# Patient Record
Sex: Male | Born: 1960 | Race: White | Hispanic: No | Marital: Married | Smoking: Never smoker
Health system: Southern US, Community
[De-identification: ages and names within clinical notes are randomized; demographics above are authoritative.]

## PROBLEM LIST (undated history)

## (undated) DIAGNOSIS — I10 Essential (primary) hypertension: Secondary | ICD-10-CM

## (undated) DIAGNOSIS — F419 Anxiety disorder, unspecified: Secondary | ICD-10-CM

## (undated) DIAGNOSIS — M1990 Unspecified osteoarthritis, unspecified site: Secondary | ICD-10-CM

## (undated) DIAGNOSIS — E119 Type 2 diabetes mellitus without complications: Secondary | ICD-10-CM

## (undated) HISTORY — PX: ROTATOR CUFF REPAIR: SHX139

## (undated) HISTORY — PX: BACK SURGERY: SHX140

## (undated) HISTORY — PX: KNEE SURGERY: SHX244

---

## 2001-05-10 ENCOUNTER — Encounter: Payer: Self-pay | Admitting: Emergency Medicine

## 2001-05-10 ENCOUNTER — Emergency Department (HOSPITAL_COMMUNITY): Admission: EM | Admit: 2001-05-10 | Discharge: 2001-05-10 | Payer: Self-pay | Admitting: *Deleted

## 2014-02-22 ENCOUNTER — Emergency Department (HOSPITAL_COMMUNITY): Payer: BC Managed Care – PPO

## 2014-02-22 ENCOUNTER — Emergency Department (HOSPITAL_COMMUNITY)
Admission: EM | Admit: 2014-02-22 | Discharge: 2014-02-22 | Disposition: A | Discharge status: Home / Self Care | Payer: BC Managed Care – PPO | Attending: Emergency Medicine | Admitting: Emergency Medicine

## 2014-02-22 ENCOUNTER — Encounter (HOSPITAL_COMMUNITY): Payer: Self-pay | Admitting: Emergency Medicine

## 2014-02-22 DIAGNOSIS — L03211 Cellulitis of face: Secondary | ICD-10-CM

## 2014-02-22 DIAGNOSIS — L0201 Cutaneous abscess of face: Secondary | ICD-10-CM | POA: Insufficient documentation

## 2014-02-22 DIAGNOSIS — R51 Headache: Secondary | ICD-10-CM | POA: Insufficient documentation

## 2014-02-22 DIAGNOSIS — R599 Enlarged lymph nodes, unspecified: Secondary | ICD-10-CM | POA: Insufficient documentation

## 2014-02-22 LAB — CBC WITH DIFFERENTIAL/PLATELET
Basophils Absolute: 0 10*3/uL (ref 0.0–0.1)
Basophils Relative: 0 % (ref 0–1)
Eosinophils Absolute: 0.2 10*3/uL (ref 0.0–0.7)
Eosinophils Relative: 1 % (ref 0–5)
HCT: 35.7 % — ABNORMAL LOW (ref 39.0–52.0)
Hemoglobin: 12 g/dL — ABNORMAL LOW (ref 13.0–17.0)
Lymphocytes Relative: 21 % (ref 12–46)
Lymphs Abs: 3.3 10*3/uL (ref 0.7–4.0)
MCH: 29.6 pg (ref 26.0–34.0)
MCHC: 33.6 g/dL (ref 30.0–36.0)
MCV: 88.1 fL (ref 78.0–100.0)
Monocytes Absolute: 1.4 10*3/uL — ABNORMAL HIGH (ref 0.1–1.0)
Monocytes Relative: 9 % (ref 3–12)
Neutro Abs: 11 10*3/uL — ABNORMAL HIGH (ref 1.7–7.7)
Neutrophils Relative %: 69 % (ref 43–77)
Platelets: 351 10*3/uL (ref 150–400)
RBC: 4.05 MIL/uL — ABNORMAL LOW (ref 4.22–5.81)
RDW: 13.3 % (ref 11.5–15.5)
WBC: 15.9 10*3/uL — ABNORMAL HIGH (ref 4.0–10.5)

## 2014-02-22 LAB — I-STAT CHEM 8, ED
BUN: <3 mg/dL — ABNORMAL LOW (ref 6–23)
Calcium, Ion: 1.25 mmol/L — ABNORMAL HIGH (ref 1.12–1.23)
Chloride: 98 mEq/L (ref 96–112)
Creatinine, Ser: 0.7 mg/dL (ref 0.50–1.35)
Glucose, Bld: 82 mg/dL (ref 70–99)
HCT: 38 % — ABNORMAL LOW (ref 39.0–52.0)
Hemoglobin: 12.9 g/dL — ABNORMAL LOW (ref 13.0–17.0)
Potassium: 3.5 mEq/L — ABNORMAL LOW (ref 3.7–5.3)
Sodium: 141 mEq/L (ref 137–147)
TCO2: 28 mmol/L (ref 0–100)

## 2014-02-22 MED ORDER — MORPHINE SULFATE 4 MG/ML IJ SOLN
4.0000 mg | Freq: Once | INTRAMUSCULAR | Status: AC
  Administered 2014-02-22: 4 mg via INTRAVENOUS
  Filled 2014-02-22: qty 1

## 2014-02-22 MED ORDER — IOHEXOL 300 MG/ML  SOLN
100.0000 mL | Freq: Once | INTRAMUSCULAR | Status: AC | PRN
  Administered 2014-02-22: 100 mL via INTRAVENOUS

## 2014-02-22 MED ORDER — CLINDAMYCIN PHOSPHATE 600 MG/50ML IV SOLN
600.0000 mg | Freq: Once | INTRAVENOUS | Status: AC
  Administered 2014-02-22: 600 mg via INTRAVENOUS
  Filled 2014-02-22: qty 50

## 2014-02-22 MED ORDER — HYDROMORPHONE HCL PF 1 MG/ML IJ SOLN
1.0000 mg | Freq: Once | INTRAMUSCULAR | Status: AC
  Administered 2014-02-22: 1 mg via INTRAVENOUS
  Filled 2014-02-22: qty 1

## 2014-02-22 NOTE — ED Notes (Signed)
Pt complaining of right sided facial swelling. Pt has been taking pain meds and abx. Doctor sent here for cellulitis.

## 2014-02-22 NOTE — Discharge Instructions (Signed)
Continue with warm compresses applied to affected area 20 minutes each several times daily. Take antibiotic as prescribed for the full duration. Take pain medication as needed. If you noticed no improvement after 48 hours please return to the ER for further evaluation.  Cellulitis Cellulitis is an infection of the skin and the tissue beneath it. The infected area is usually red and tender. Cellulitis occurs most often in the arms and lower legs.  CAUSES  Cellulitis is caused by bacteria that enter the skin through cracks or cuts in the skin. The most common types of bacteria that cause cellulitis are Staphylococcus and Streptococcus. SYMPTOMS   Redness and warmth.  Swelling.  Tenderness or pain.  Fever. DIAGNOSIS  Your caregiver can usually determine what is wrong based on a physical exam. Blood tests may also be done. TREATMENT  Treatment usually involves taking an antibiotic medicine. HOME CARE INSTRUCTIONS   Take your antibiotics as directed. Finish them even if you start to feel better.  Keep the infected arm or leg elevated to reduce swelling.  Apply a warm cloth to the affected area up to 4 times per day to relieve pain.  Only take over-the-counter or prescription medicines for pain, discomfort, or fever as directed by your caregiver.  Keep all follow-up appointments as directed by your caregiver. SEEK MEDICAL CARE IF:   You notice red streaks coming from the infected area.  Your red area gets larger or turns dark in color.  Your bone or joint underneath the infected area becomes painful after the skin has healed.  Your infection returns in the same area or another area.  You notice a swollen bump in the infected area.  You develop new symptoms. SEEK IMMEDIATE MEDICAL CARE IF:   You have a fever.  You feel very sleepy.  You develop vomiting or diarrhea.  You have a general ill feeling (malaise) with muscle aches and pains. MAKE SURE YOU:   Understand these  instructions.  Will watch your condition.  Will get help right away if you are not doing well or get worse. Document Released: 05/27/2005 Document Revised: 02/16/2012 Document Reviewed: 11/02/2011 [facility] Patient Information 2015 [employer] [place]. This information is not intended to replace advice given to you by your health care provider. Make sure you discuss any questions you have with your health care provider.

## 2014-02-22 NOTE — ED Provider Notes (Signed)
Medical screening examination/treatment/procedure(s) were conducted as a shared visit with non-physician practitioner(s) and myself.  I personally evaluated the patient during the encounter.   EKG Interpretation None       Doug SouSam Jr Milliron, MD 02/22/14 2200

## 2014-02-22 NOTE — ED Provider Notes (Signed)
CSN: [id number]     Arrival date & time 02/22/14  1242 History   First MD Initiated Contact with Patient 02/22/14 1442     Chief Complaint  Patient presents with  . Facial Swelling     (Consider location/radiation/quality/duration/timing/severity/associated sxs/prior Treatment) HPI  53 year old male who was sent here from his primary care Dr. for further evaluation and management of his right face infection.  Patient reports he popped a facial pimple about week ago. Pimple is to the left side of face. Subsequently it became inflamed, and now is a throbbing sharp pain with redness throughout his left side of face. Pain is unbearable worsen with movement or with palpation. He was seen at urgent care yesterday for this complaint. States they performed an I&D with minimal drainage. He was prescribed mupirocin, and also prescribe Keflex and Bactrim. Although he is taking his medications since yesterday to provide actually no improvement, his symptoms worsen. No fever, chills, hearing changes, nausea vomiting diarrhea, or rash. He is up-to-date with his tetanus. He has no known documented history of diabetes however is taking metformin. He has a recent right shoulder surgical repair a month ago and currently taking pain medication for that. States his pain medication has not helped with the symptoms.  History reviewed. No pertinent past medical history. Past Surgical History  Procedure Laterality Date  . Back surgery    . Rotator cuff repair     History reviewed. No pertinent family history. History  Substance Use Topics  . Smoking status: Never Smoker   . Smokeless tobacco: Not on file  . Alcohol Use: No    Review of Systems  Constitutional: Negative for fever.  Skin: Positive for rash.  Neurological: Positive for headaches.      Allergies  Review of patient's allergies indicates no known allergies.  Home Medications   Prior to Admission medications   Not on File   BP 115/70   Pulse 110  Temp(Src) 97.8 F (36.6 C) (Oral)  Resp 18  Wt 165 lb (74.844 kg)  SpO2 98% Physical Exam  Constitutional: He is oriented to person, place, and time. He appears well-developed and well-nourished. No distress.  HENT:  Head: Atraumatic.  Left Ear: External ear normal.  L side of face: an area of induration noted anterior to L earlobe with honey color crust drainage and significant erythema involving L earlobe, L temporal region and radiates to proxima left jaw.  Markedly tender.    Eyes: Conjunctivae are normal.  Neck: Normal range of motion. Neck supple.  Cardiovascular:  Tachycardiac without M/R/G  Pulmonary/Chest: Effort normal and breath sounds normal.  Abdominal: Soft. There is no tenderness.  Lymphadenopathy:    He has cervical adenopathy.  Neurological: He is alert and oriented to person, place, and time.  Skin: No rash noted.  Psychiatric: He has a normal mood and affect.    ED Course  Procedures (including critical care time)  3:02 PM Cellulitis and possible abscess noted to L side of face.  Pt just started on abx, but haven't given it adequate time to work yet.  Here with worsening pain.  Given location of infection my fear of I&D that could potentially injure facial nerves.  Therefore will consider CT for further evaluation.   8:20 PM CT demonstrates evidence of cellulitis but no discrete abscess.  Pt received IV clindamycin in ER along with pain management.  Pt will continue outpt abx regimen and return in 48 hrs if no improvement.  Otherwise pt  able to tolerates PO.  Care discussed with Dr. Ethelda ChickJacubowitz.    Labs Review Labs Reviewed  CBC WITH DIFFERENTIAL - Abnormal; Notable for the following:    WBC 15.9 (*)    RBC 4.05 (*)    Hemoglobin 12.0 (*)    HCT 35.7 (*)    Neutro Abs 11.0 (*)    Monocytes Absolute 1.4 (*)    All other components within normal limits  I-STAT CHEM 8, ED - Abnormal; Notable for the following:    Potassium 3.5 (*)    BUN <3 (*)     Calcium, Ion 1.25 (*)    Hemoglobin 12.9 (*)    HCT 38.0 (*)    All other components within normal limits    Imaging Review Ct Maxillofacial W/cm  02/22/2014   CLINICAL DATA:  Cellulitis, soft tissue swelling over the left temporal region  EXAM: CT MAXILLOFACIAL WITH CONTRAST  TECHNIQUE: Multidetector CT imaging of the maxillofacial structures was performed with intravenous contrast. Multiplanar CT image reconstructions were also generated. A small metallic BB was placed on the right temple in order to reliably differentiate right from left.  CONTRAST:  100mL OMNIPAQUE IOHEXOL 300 MG/ML  SOLN  COMPARISON:  None.  FINDINGS: There is soft tissue swelling and fat stranding within the subcutaneous fat along the left temple extending into the left anterior neck superficial to the platysma, without a drainable fluid collection. The globes are intact. The orbital walls are intact. The orbital floors are intact. The maxilla is intact. The mandible is intact. The zygomatic arches are intact. The nasal septum is midline. There is no nasal bone fracture. The temporomandibular joints are normal.  The paranasal sinuses are clear. The visualized portions of the mastoid sinuses are well aerated.  There is degenerative disc disease with a broad-based disc bulge at C5-6 and C6-7.  There is a 6 mm hypodense nodule in the left lobe of the thyroid gland.  IMPRESSION: Soft tissue swelling and fat stranding within the subcutaneous fat of the left temple and left neck concerning for cellulitis without a drainable abscess.   Electronically Signed   By: Elige KoHetal  Patel   On: 02/22/2014 18:10     EKG Interpretation None      MDM   Final diagnoses:  Cellulitis, face    BP 132/79  Pulse 99  Temp(Src) 97.8 F (36.6 C) (Oral)  Resp 18  Wt 165 lb (74.844 kg)  SpO2 97%  I have reviewed nursing notes and vital signs. I personally reviewed the imaging tests through PACS system  I reviewed available ER/hospitalization  records thought the EMR     Fayrene HelperBowie Tran, [place] JerseyPA-C 02/22/14 2021

## 2014-02-22 NOTE — ED Provider Notes (Signed)
Plains of swelling and pain to the left side of face onset 3 or 4 days ago. Patient seen by Dr tafeen for same complaint, described hydromorphone and mupirocin, Bactrim and Keflex presents today his pain continues. Denies fever denies nausea or vomiting. No other associated symptoms  Jacob SouSam Kyndle Schlender, MD 02/22/14 [phone]

## 2018-11-29 ENCOUNTER — Encounter: Payer: Self-pay | Admitting: *Deleted

## 2019-10-09 ENCOUNTER — Other Ambulatory Visit: Payer: Self-pay

## 2019-10-09 ENCOUNTER — Telehealth: Payer: Self-pay

## 2019-10-09 ENCOUNTER — Ambulatory Visit: Payer: Self-pay | Attending: Internal Medicine

## 2019-10-09 DIAGNOSIS — Z20822 Contact with and (suspected) exposure to covid-19: Secondary | ICD-10-CM | POA: Insufficient documentation

## 2019-10-09 NOTE — Telephone Encounter (Signed)
Pt's wife called to get pt an appt. To get covid screening due to exposure. Pt is not having sx. Appt made for today for 12:45.

## 2019-10-10 LAB — NOVEL CORONAVIRUS, NAA: SARS-CoV-2, NAA: NOT DETECTED

## 2021-08-12 ENCOUNTER — Other Ambulatory Visit: Payer: Self-pay

## 2021-08-12 ENCOUNTER — Encounter: Payer: Self-pay | Admitting: Orthopaedic Surgery

## 2021-08-12 ENCOUNTER — Ambulatory Visit: Payer: Self-pay

## 2021-08-12 ENCOUNTER — Ambulatory Visit: Payer: BC Managed Care – PPO | Admitting: Orthopaedic Surgery

## 2021-08-12 VITALS — Ht 66.0 in | Wt 200.0 lb

## 2021-08-12 DIAGNOSIS — M1611 Unilateral primary osteoarthritis, right hip: Secondary | ICD-10-CM | POA: Diagnosis not present

## 2021-08-12 NOTE — Progress Notes (Signed)
Office Visit Note   Patient: Jacob Bates           Date of Birth: [birth year]           MRN: [id number] Visit Date: 08/12/2021              Requested by: No referring provider defined for this encounter. PCP: No primary care provider on file.   Assessment & Plan: Visit Diagnoses:  1. Primary osteoarthritis of right hip     Plan: Impression is end-stage right hip DJD.  He has not received any relief from cortisone injections and medications and use of a cane.  He has significant limitation in mobility and loss of quality of life.  Given his options he has elected to proceed with a right total hip replacement towards the end of January because of recent cortisone injection.  Risk benefits rehab recovery reviewed with the patient detail.  Questions encouraged and answered.  We will obtain hemoglobin A1c and prealbumin levels today.  Follow-Up Instructions: No follow-ups on file.   Orders:  Orders Placed This Encounter  Procedures   XR HIP UNILAT W OR W/O PELVIS 2-3 VIEWS RIGHT   Hemoglobin A1C   Prealbumin   No orders of the defined types were placed in this encounter.     Procedures: No procedures performed   Clinical Data: No additional findings.   Subjective: Chief Complaint  Patient presents with   Right Hip - Pain    [name] a very pleasant 60 year old gentleman with chronic low back pain and takes chronic oxycodone who comes in for chronic severe right hip and groin pain that has been worsening.  He has seen his primary care doctor who initially managed his pain with medications but eventually made a referral to [facility] orthopedics in [place].  He was evaluated by Dr. Orson Aloe there and he underwent intra-articular cortisone injection which gave him relief for about 3 days.  This injection was performed on 07/29/2021.  He states that he continues to have severe pain now that the shot has worn off.  He cannot sleep.  He walks with a cane.  He has a brother Jacob Bates who  is scheduled for a total hip replacement with me in the near future.   Review of Systems  Constitutional: Negative.   All other systems reviewed and are negative.   Objective: Vital Signs: Ht 5\' 6"  (1.676 m)    Wt 200 lb (90.7 kg)    BMI 32.28 kg/m   Physical Exam Vitals and nursing note reviewed.  Constitutional:      Appearance: He is well-developed.  Pulmonary:     Effort: Pulmonary effort is normal.  Abdominal:     Palpations: Abdomen is soft.  Skin:    General: Skin is warm.  Neurological:     Mental Status: He is alert and oriented to person, place, and time.  Psychiatric:        Behavior: Behavior normal.        Thought Content: Thought content normal.        Judgment: Judgment normal.    Ortho Exam  Right hip exam shows excruciating pain with any attempted range of motion.  Unable to perform Stinchfield sign secondary to pain.  Pain with logroll.  Positive FADIR at 0 degrees.  Lateral hip is nontender.  No sciatic tension signs.  Specialty Comments:  No specialty comments available.  Imaging: XR HIP UNILAT W OR W/O PELVIS 2-3 VIEWS RIGHT  Result Date: 08/12/2021  Bone-on-bone joint space narrowing of the right hip joint.    PMFS History: There are no problems to display for this patient.  History reviewed. No pertinent past medical history.  No family history on file.  Past Surgical History:  Procedure Laterality Date   BACK SURGERY     ROTATOR CUFF REPAIR     Social History   Occupational History   Not on file  Tobacco Use   Smoking status: Never   Smokeless tobacco: Not on file  Substance and Sexual Activity   Alcohol use: No   Drug use: Not on file   Sexual activity: Not on file

## 2021-08-13 LAB — PREALBUMIN: Prealbumin: 38 mg/dL (ref 21–43)

## 2021-08-13 LAB — HEMOGLOBIN A1C
Hgb A1c MFr Bld: 6.5 % of total Hgb — ABNORMAL HIGH (ref <5.7)
Mean Plasma Glucose: 140 mg/dL
eAG (mmol/L): 7.7 mmol/L

## 2021-09-11 ENCOUNTER — Other Ambulatory Visit: Payer: Self-pay

## 2021-09-22 ENCOUNTER — Other Ambulatory Visit: Payer: Self-pay | Admitting: Physician Assistant

## 2021-09-22 MED ORDER — ASPIRIN EC 81 MG PO TBEC: 81.0000 mg | DELAYED_RELEASE_TABLET | Freq: Two times a day (BID) | ORAL | 0 refills | Status: AC

## 2021-09-22 MED ORDER — DOCUSATE SODIUM 100 MG PO CAPS: 100.0000 mg | ORAL_CAPSULE | Freq: Every day | ORAL | 2 refills | Status: AC | PRN

## 2021-09-22 MED ORDER — ONDANSETRON HCL 4 MG PO TABS: 4.0000 mg | ORAL_TABLET | Freq: Three times a day (TID) | ORAL | 0 refills | Status: AC | PRN

## 2021-09-22 MED ORDER — OXYCODONE-ACETAMINOPHEN 5-325 MG PO TABS: 1.0000 | ORAL_TABLET | Freq: Four times a day (QID) | ORAL | 0 refills | Status: AC | PRN

## 2021-09-22 MED ORDER — METHOCARBAMOL 500 MG PO TABS: 500.0000 mg | ORAL_TABLET | Freq: Two times a day (BID) | ORAL | 0 refills | Status: AC | PRN

## 2021-09-24 NOTE — Pre-Procedure Instructions (Addendum)
Surgical Instructions    Your procedure is scheduled on Monday, September 29, 2021 at 3:08 PM.  Report to [facility] Main Entrance "A" at 1:00 P.M., then check in with the Admitting office.  Call this number if you have problems the morning of surgery:  [phone]   If you have any questions prior to your surgery date call [phone]: Open Monday-Friday 8am-4pm    Remember:  Do not eat after midnight the night before your surgery  You may drink clear liquids until 12:00 PM the morning of your surgery.   Clear liquids allowed are: Water, Non-Citrus Juices (without pulp), Carbonated Beverages, Clear Tea, Black Coffee Only (NO MILK, CREAM OR POWDERED CREAMER of any kind), and Gatorade.    Enhanced Recovery after Surgery for Orthopedics Enhanced Recovery after Surgery is a protocol used to improve the stress on your body and your recovery after surgery.  Patient Instructions  The day of surgery (if you have diabetes):  Drink ONE small 10 oz bottle of water by 12:00 pm the morning of surgery This bottle was given to you during your hospital  pre-op appointment visit.  Nothing else to drink after completing the  Small 10 oz bottle of water.         If you have questions, please contact your surgeons office.   Take these medicines the morning of surgery with A SIP OF WATER:  esomeprazole (NEXIUM) gabapentin (NEURONTIN)   IF NEEDED: acetaminophen (TYLENOL)  diphenhydrAMINE (BENADRYL) fluticasone (FLONASE) methocarbamol (ROBAXIN)  ondansetron (ZOFRAN) oxycodone (ROXICODONE)  As of today, STOP taking any Aspirin (unless otherwise instructed by your surgeon) Aleve, Naproxen, Ibuprofen, Motrin, Advil, Goody's, BC's, all herbal medications, fish oil, and all vitamins.  WHAT DO I DO ABOUT MY DIABETES MEDICATION?   Do not take metFORMIN (GLUCOPHAGE-XR) the morning of surgery.   HOW TO MANAGE YOUR DIABETES BEFORE AND AFTER SURGERY  Why is it important to control my blood  sugar before and after surgery? Improving blood sugar levels before and after surgery helps healing and can limit problems. A way of improving blood sugar control is eating a healthy diet by:  Eating less sugar and carbohydrates  Increasing activity/exercise  Talking with your doctor about reaching your blood sugar goals High blood sugars (greater than 180 mg/dL) can raise your risk of infections and slow your recovery, so you will need to focus on controlling your diabetes during the weeks before surgery. Make sure that the doctor who takes care of your diabetes knows about your planned surgery including the date and location.  How do I manage my blood sugar before surgery? Check your blood sugar at least 4 times a day, starting 2 days before surgery, to make sure that the level is not too high or low.  Check your blood sugar the morning of your surgery when you wake up and every 2 hours until you get to the Short Stay unit.  If your blood sugar is less than 70 mg/dL, you will need to treat for low blood sugar: Do not take insulin. Treat a low blood sugar (less than 70 mg/dL) with  cup of clear juice (cranberry or apple), 4 glucose tablets, OR glucose gel. Recheck blood sugar in 15 minutes after treatment (to make sure it is greater than 70 mg/dL). If your blood sugar is not greater than 70 mg/dL on recheck, call [phone] for further instructions. Report your blood sugar to the short stay nurse when you get to Short Stay.  If you  are admitted to the hospital after surgery: Your blood sugar will be checked by the staff and you will probably be given insulin after surgery (instead of oral diabetes medicines) to make sure you have good blood sugar levels. The goal for blood sugar control after surgery is 80-180 mg/dL.                      Do NOT Smoke (Tobacco/Vaping) for 24 hours prior to your procedure.  If you use a CPAP at night, you may bring your mask/headgear for your overnight  stay.   Contacts, glasses, piercing's, hearing aid's, dentures or partials may not be worn into surgery, please bring cases for these belongings.    For patients admitted to the hospital, discharge time will be determined by your treatment team.   Patients discharged the day of surgery will not be allowed to drive home, and someone needs to stay with them for 24 hours.  NO VISITORS WILL BE ALLOWED IN PRE-OP WHERE PATIENTS ARE PREPPED FOR SURGERY.  ONLY 1 SUPPORT PERSON MAY BE PRESENT IN THE WAITING ROOM WHILE YOU ARE IN SURGERY.  IF YOU ARE TO BE ADMITTED, ONCE YOU ARE IN YOUR ROOM YOU WILL BE ALLOWED TWO (2) VISITORS. (1) VISITOR MAY STAY OVERNIGHT BUT MUST ARRIVE TO THE ROOM BY 8pm.  Minor children may have two parents present. Special consideration for safety and communication needs will be reviewed on a case by case basis.   Special instructions:   [place] [facility]- Preparing For Surgery  Before surgery, you can play an important role. Because skin is not sterile, your skin needs to be as free of germs as possible. You can reduce the number of germs on your skin by washing with CHG (chlorahexidine gluconate) Soap before surgery.  CHG is an antiseptic cleaner which kills germs and bonds with the skin to continue killing germs even after washing.    Oral Hygiene is also important to reduce your risk of infection.  Remember - BRUSH YOUR TEETH THE MORNING OF SURGERY WITH YOUR REGULAR TOOTHPASTE  Please do not use if you have an allergy to CHG or antibacterial soaps. If your skin becomes reddened/irritated stop using the CHG.  Do not shave (including legs and underarms) for at least 48 hours prior to first CHG shower. It is OK to shave your face.  Please follow these instructions carefully.   Shower the NIGHT BEFORE SURGERY and the MORNING OF SURGERY  If you chose to wash your hair, wash your hair first as usual with your normal shampoo.  After you shampoo, rinse your hair and body thoroughly to  remove the shampoo.  Use CHG Soap as you would any other liquid soap. You can apply CHG directly to the skin and wash gently with a scrungie or a clean washcloth.   Apply the CHG Soap to your body ONLY FROM THE NECK DOWN.  Do not use on open wounds or open sores. Avoid contact with your eyes, ears, mouth and genitals (private parts). Wash Face and genitals (private parts)  with your normal soap.   Wash thoroughly, paying special attention to the area where your surgery will be performed.  Thoroughly rinse your body with warm water from the neck down.  DO NOT shower/wash with your normal soap after using and rinsing off the CHG Soap.  Pat yourself dry with a CLEAN TOWEL.  Wear CLEAN PAJAMAS to bed the night before surgery  Place CLEAN SHEETS on your  bed the night before your surgery  DO NOT SLEEP WITH PETS.   Day of Surgery: Shower with CHG soap. Do not wear jewelry. Do not wear lotions, powders, colognes, or deodorant. Do not shave 48 hours prior to surgery.  Men may shave face and neck. Do not bring valuables to the hospital. [facility] is not responsible for any belongings or valuables. Wear Clean/Comfortable clothing the morning of surgery Remember to brush your teeth WITH YOUR REGULAR TOOTHPASTE.   Please read over the following fact sheets that you were given.   3 days prior to your procedure or After your COVID test   You are not required to quarantine however you are required to wear a well-fitting mask when you are out and around people not in your household. If your mask becomes wet or soiled, replace with a new one.   Wash your hands often with soap and water for 20 seconds or clean your hands with an alcohol-based hand sanitizer that contains at least 60% alcohol.   Do not share personal items.   Notify your provider:  o if you are in close contact with someone who has COVID  o or if you develop a fever of 100.4 or greater, sneezing, cough, sore throat,  shortness of breath or body aches.

## 2021-09-25 ENCOUNTER — Other Ambulatory Visit: Payer: Self-pay | Admitting: Physician Assistant

## 2021-09-25 ENCOUNTER — Inpatient Hospital Stay (HOSPITAL_COMMUNITY)
Admission: RE | Admit: 2021-09-25 | Discharge: 2021-09-25 | Disposition: A | Discharge status: Home / Self Care | Payer: BC Managed Care – PPO | Source: Ambulatory Visit

## 2021-09-26 ENCOUNTER — Other Ambulatory Visit: Payer: Self-pay

## 2021-09-26 ENCOUNTER — Encounter (HOSPITAL_COMMUNITY): Payer: Self-pay

## 2021-09-26 ENCOUNTER — Telehealth: Payer: Self-pay | Admitting: Orthopaedic Surgery

## 2021-09-26 ENCOUNTER — Encounter (HOSPITAL_COMMUNITY)
Admission: RE | Admit: 2021-09-26 | Discharge: 2021-09-26 | Disposition: A | Discharge status: Home / Self Care | Payer: BC Managed Care – PPO | Source: Ambulatory Visit | Attending: Orthopaedic Surgery | Admitting: Orthopaedic Surgery

## 2021-09-26 VITALS — BP 152/101 | HR 96 | Temp 98.3°F | Resp 19 | Ht 66.0 in | Wt 218.0 lb

## 2021-09-26 DIAGNOSIS — I10 Essential (primary) hypertension: Secondary | ICD-10-CM | POA: Diagnosis not present

## 2021-09-26 DIAGNOSIS — M1611 Unilateral primary osteoarthritis, right hip: Secondary | ICD-10-CM | POA: Insufficient documentation

## 2021-09-26 DIAGNOSIS — Z20822 Contact with and (suspected) exposure to covid-19: Secondary | ICD-10-CM | POA: Insufficient documentation

## 2021-09-26 DIAGNOSIS — Z01818 Encounter for other preprocedural examination: Secondary | ICD-10-CM | POA: Diagnosis present

## 2021-09-26 DIAGNOSIS — E119 Type 2 diabetes mellitus without complications: Secondary | ICD-10-CM | POA: Insufficient documentation

## 2021-09-26 HISTORY — DX: Anxiety disorder, unspecified: F41.9

## 2021-09-26 HISTORY — DX: Unspecified osteoarthritis, unspecified site: M19.90

## 2021-09-26 HISTORY — DX: Type 2 diabetes mellitus without complications: E11.9

## 2021-09-26 HISTORY — DX: Essential (primary) hypertension: I10

## 2021-09-26 LAB — CBC WITH DIFFERENTIAL/PLATELET
Abs Immature Granulocytes: 0.03 10*3/uL (ref 0.00–0.07)
Basophils Absolute: 0.1 10*3/uL (ref 0.0–0.1)
Basophils Relative: 1 %
Eosinophils Absolute: 0.1 10*3/uL (ref 0.0–0.5)
Eosinophils Relative: 2 %
HCT: 40.8 % (ref 39.0–52.0)
Hemoglobin: 13.2 g/dL (ref 13.0–17.0)
Immature Granulocytes: 0 %
Lymphocytes Relative: 24 %
Lymphs Abs: 1.8 10*3/uL (ref 0.7–4.0)
MCH: 28.6 pg (ref 26.0–34.0)
MCHC: 32.4 g/dL (ref 30.0–36.0)
MCV: 88.3 fL (ref 80.0–100.0)
Monocytes Absolute: 0.7 10*3/uL (ref 0.1–1.0)
Monocytes Relative: 10 %
Neutro Abs: 4.8 10*3/uL (ref 1.7–7.7)
Neutrophils Relative %: 63 %
Platelets: 334 10*3/uL (ref 150–400)
RBC: 4.62 MIL/uL (ref 4.22–5.81)
RDW: 14 % (ref 11.5–15.5)
WBC: 7.4 10*3/uL (ref 4.0–10.5)
nRBC: 0 % (ref 0.0–0.2)

## 2021-09-26 LAB — GLUCOSE, CAPILLARY: Glucose-Capillary: 119 mg/dL — ABNORMAL HIGH (ref 70–99)

## 2021-09-26 LAB — COMPREHENSIVE METABOLIC PANEL
ALT: 18 U/L (ref 0–44)
AST: 18 U/L (ref 15–41)
Albumin: 3.7 g/dL (ref 3.5–5.0)
Alkaline Phosphatase: 74 U/L (ref 38–126)
Anion gap: 6 (ref 5–15)
BUN: 8 mg/dL (ref 8–23)
CO2: 29 mmol/L (ref 22–32)
Calcium: 8.9 mg/dL (ref 8.9–10.3)
Chloride: 105 mmol/L (ref 98–111)
Creatinine, Ser: 0.83 mg/dL (ref 0.61–1.24)
GFR, Estimated: >60 mL/min (ref >60)
Glucose, Bld: 111 mg/dL — ABNORMAL HIGH (ref 70–99)
Potassium: 3.9 mmol/L (ref 3.5–5.1)
Sodium: 140 mmol/L (ref 135–145)
Total Bilirubin: 0.6 mg/dL (ref 0.3–1.2)
Total Protein: 6.5 g/dL (ref 6.5–8.1)

## 2021-09-26 LAB — SURGICAL PCR SCREEN
MRSA, PCR: NEGATIVE
Staphylococcus aureus: NEGATIVE

## 2021-09-26 LAB — SARS CORONAVIRUS 2 (TAT 6-24 HRS): SARS Coronavirus 2: NEGATIVE

## 2021-09-26 MED ORDER — TRANEXAMIC ACID 1000 MG/10ML IV SOLN
2000.0000 mg | INTRAVENOUS | Status: AC
  Administered 2021-09-29: 2000 mg via TOPICAL
  Filled 2021-09-26: qty 20

## 2021-09-26 NOTE — Telephone Encounter (Signed)
He shouldn't need abx.  1-2 weeks with him.

## 2021-09-26 NOTE — Progress Notes (Signed)
PCP - Dr. Ceasar Mons Cardiologist - Denies  PPM/ICD - Denies  Chest x-ray - N/A EKG - 09/26/21 Stress Test - Denies ECHO - Denies Cardiac Cath - Denies  Sleep Study - Denies  Fasting Blood Sugar - 100-130s Checks Blood Sugar _2-3_ times a day  Blood Thinner Instructions: N/A Aspirin Instructions: Follow surgeon's instructions.  ERAS Protcol - Yes, G2  COVID TEST- 09/26/21 in PAT   Anesthesia review: Yes, abnormal EKG  Patient denies shortness of breath, fever, cough and chest pain at PAT appointment   All instructions explained to the patient, with a verbal understanding of the material. Patient agrees to go over the instructions while at home for a better understanding. Patient also instructed to self quarantine after being tested for COVID-19. The opportunity to ask questions was provided.

## 2021-09-26 NOTE — Progress Notes (Signed)
°   09/26/21 1024  OBSTRUCTIVE SLEEP APNEA  Have you ever been diagnosed with sleep apnea through a sleep study? No  Do you snore loudly (loud enough to be heard through closed doors)?  0  Do you often feel tired, fatigued, or sleepy during the daytime (such as falling asleep during driving or talking to someone)? 0  Has anyone observed you stop breathing during your sleep? 0  Do you have, or are you being treated for high blood pressure? 1  BMI more than 35 kg/m2? 1  Age > 50 (1-yes) 1  Neck circumference greater than:Male 16 inches or larger, Male 17inches or larger? 1  Male Gender (Yes=1) 1  Obstructive Sleep Apnea Score 5  Score 5 or greater  Results sent to PCP

## 2021-09-26 NOTE — Telephone Encounter (Signed)
Patient's wife Dois Davenport called needing to know how long will patient be out of work so that she can advise her employer how long she will be home taking care of him.   Dois Davenport said patient is having surgery Monday.  The number to contact Dois Davenport is [phone]

## 2021-09-26 NOTE — Telephone Encounter (Signed)
Sandra aware

## 2021-09-26 NOTE — Progress Notes (Signed)
Anesthesia Chart Review:  Case: [id number] Date/Time: 09/29/21 1205   Procedure: RIGHT TOTAL HIP ARTHROPLASTY ANTERIOR APPROACH (Right: Hip) - 3-C   Anesthesia type: Spinal   Pre-op diagnosis: RIGHT HIP DEGENERATIVE JOINT DISEASE   Location: [facility] ROOM 04 / [facility]   Surgeons: Tarry Kos, MD       DISCUSSION: Patient is a 61 year old male scheduled for the above procedure.  History includes never smoker, HTN, DM2, anxiety, arthritis, spinal surgery (~ 2011). Urology is following for an right adrenal nodule (see below PROVIDERS).  BMI is consistent with obesity.  OSA screening score was 5.  BP was elevated at 152/101 at Friday 09/26/21 PAT RN visit.  I do not see that it was rechecked.  Comparison BP on 04/21/2021 ([facility]) was 155/79.  He is on lisinopril 10 mg daily.  He will get vitals on arrival the day of surgery.  09/26/2021 presurgical COVID-19 test negative.  Anesthesia team to evaluate on the day of surgery.   VS: BP (!) 152/101    Pulse 96    Temp 36.8 C    Resp 19    Ht 5\' 6"  (1.676 m)    Wt 98.9 kg    SpO2 95%    BMI 35.19 kg/m     PROVIDERS: , MD is PCP [facility], [place]) [place], MD is urologist Laurence Compton). Seen in August 2022 for incidental finding of right adrenal nodule.  05/02/21 Serum/Blood labs: Normetanephrine, PI 119.6 (0.0-285.2 pg/mL) and Metanephrine, PI 34.0 (0.0-34.0 pg/mL), aldosterone 1.7 ng/dL, cortisol 1.1 ug/dL, PSA 1.2 ng/ML. 92/22 CT showed benign marginal characteristics of the right adrenal nodule, but cannot be clearly diagnosed as fat-containing adenoma, so periodic follow-up indicated.   LABS: Labs reviewed: Acceptable for surgery. A1c 6.5% 08/12/21.  (all labs ordered are listed, but only abnormal results are displayed)  Labs Reviewed  COMPREHENSIVE METABOLIC PANEL - Abnormal; Notable for the following components:      Result Value   Glucose, Bld 111 (*)    All other components within normal limits  GLUCOSE, CAPILLARY -  Abnormal; Notable for the following components:   Glucose-Capillary 119 (*)    All other components within normal limits  SURGICAL PCR SCREEN  SARS CORONAVIRUS 2 (TAT 6-24 HRS)  CBC WITH DIFFERENTIAL/PLATELET     IMAGES: CT Abd 05/02/21 ([facility]): IMPRESSION:    1. Right adrenal nodule has benign marginal characteristics but it is technically indeterminate and cannot be clearly diagnosed as a fat-containing adenoma. Periodic follow-up is indicated.  2. Right upper pole cortical lesion is homogeneous and without contrast enhancement consistent with a high density cyst.    MRI L-spine 05/31/12 ([facility]): IMPRESSION:  1.  Status post RIGHT hemilaminectomy.  2.  Degenerative disc disease and facet arthrosis as described above.  There is persistent moderate RIGHT and mild LEFT neural foramen stenosis  at L5-S1.  3.  Chronic compression deformity of the L1 vertebral body.       EKG: 09/26/21: Normal sinus rhythm with sinus arrhythmia.  Isolated Q-wave in lead III.   CV: N/A  Past Medical History:  Diagnosis Date   Anxiety    Arthritis    Diabetes mellitus without complication (HCC)    Hypertension     Past Surgical History:  Procedure Laterality Date   BACK SURGERY     KNEE SURGERY Right    ROTATOR CUFF REPAIR      MEDICATIONS:  aspirin EC 81 MG tablet   docusate  sodium (COLACE) 100 MG capsule   methocarbamol (ROBAXIN) 500 MG tablet   ondansetron (ZOFRAN) 4 MG tablet   oxyCODONE-acetaminophen (PERCOCET) 5-325 MG tablet   acetaminophen (TYLENOL) 500 MG tablet   Cyanocobalamin (B-12 PO)   diphenhydrAMINE (BENADRYL) 25 MG tablet   esomeprazole (NEXIUM) 40 MG capsule   fluticasone (FLONASE) 50 MCG/ACT nasal spray   gabapentin (NEURONTIN) 600 MG tablet   lisinopril (PRINIVIL,ZESTRIL) 10 MG tablet   Magnesium 400 MG TABS   metFORMIN (GLUCOPHAGE-XR) 500 MG 24 hr tablet   oxycodone (ROXICODONE) 30 MG immediate release tablet   Pitavastatin Calcium (LIVALO) 4 MG TABS    temazepam (RESTORIL) 30 MG capsule   No current facility-administered medications for this encounter.    [START ON 09/29/2021] tranexamic acid (CYKLOKAPRON) 2,000 mg in sodium chloride 0.9 % 50 mL Topical Application    Shonna Chock, PA-C Surgical Short Stay/Anesthesiology [facility] Phone [phone] [facility] Phone [phone] 09/26/2021 4:35 PM

## 2021-09-26 NOTE — Telephone Encounter (Signed)
At least 6 weeks up to 12 weeks depending on how recovery goes.

## 2021-09-26 NOTE — Anesthesia Preprocedure Evaluation (Addendum)
Anesthesia Evaluation  Patient identified by MRN, date of birth, ID band Patient awake    Reviewed: Allergy & Precautions, H&P , NPO status , Patient's Chart, lab work & pertinent test results, reviewed documented beta blocker date and time   Airway Mallampati: II  TM Distance: >3 FB Neck ROM: full    Dental no notable dental hx. (+) Teeth Intact, Poor Dentition, Dental Advisory Given   Pulmonary neg pulmonary ROS,    Pulmonary exam normal breath sounds clear to auscultation       Cardiovascular Exercise Tolerance: Good hypertension, Pt. on medications negative cardio ROS   Rhythm:regular Rate:Normal     Neuro/Psych Anxiety negative neurological ROS  negative psych ROS   GI/Hepatic negative GI ROS, Neg liver ROS,   Endo/Other  negative endocrine ROSdiabetes  Renal/GU negative Renal ROS  negative genitourinary   Musculoskeletal  (+) Arthritis , Osteoarthritis,  narcotic dependent  Abdominal   Peds  Hematology negative hematology ROS (+)   Anesthesia Other Findings Pt on Chronic Oxy takes between 60 to 160mg  per day  Reproductive/Obstetrics negative OB ROS                           Anesthesia Physical Anesthesia Plan  ASA: 3  Anesthesia Plan: General   Post-op Pain Management: Regional block, Celebrex PO (pre-op), Gabapentin PO (pre-op), Tylenol PO (pre-op), Ketamine IV and Dilaudid IV   Induction: Intravenous  PONV Risk Score and Plan: 2 and Ondansetron, Propofol infusion and Treatment may vary due to age or medical condition  Airway Management Planned: Natural Airway, Nasal Cannula, Simple Face Mask and Mask  Additional Equipment: None  Intra-op Plan:   Post-operative Plan:   Informed Consent: I have reviewed the patients History and Physical, chart, labs and discussed the procedure including the risks, benefits and alternatives for the proposed anesthesia with the patient or  authorized representative who has indicated his/her understanding and acceptance.     Dental Advisory Given  Plan Discussed with: CRNA and Anesthesiologist  Anesthesia Plan Comments: (PAT note written 09/26/2021 by 09/28/2021, PA-C. DISCUSSION: Patient is a 61 year old male scheduled for the above procedure.  History includes never smoker, HTN, DM2, anxiety, arthritis, spinal surgery (~ 2011). Urology is following for an right adrenal nodule (see below PROVIDERS).  BMI is consistent with obesity.  OSA screening score was 5.  BP was elevated at 152/101 at Friday 09/26/21 PAT RN visit.  I do not see that it was rechecked.  Comparison BP on 04/21/2021 ([facility]) was 155/79.  He is on lisinopril 10 mg daily.  He will get vitals on arrival the day of surgery.)     Anesthesia Quick Evaluation

## 2021-09-26 NOTE — Telephone Encounter (Signed)
Called Jacob Bates to Advise. Would like to know...  How long will she need to be with him after Surgery. States he is disabled.   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 Just giving FYI.  But would like to know if he Will he needs Abx?

## 2021-09-28 DIAGNOSIS — M1611 Unilateral primary osteoarthritis, right hip: Secondary | ICD-10-CM

## 2021-09-29 ENCOUNTER — Encounter (HOSPITAL_COMMUNITY): Payer: Self-pay | Admitting: Orthopaedic Surgery

## 2021-09-29 ENCOUNTER — Ambulatory Visit (HOSPITAL_COMMUNITY): Payer: BC Managed Care – PPO

## 2021-09-29 ENCOUNTER — Encounter (HOSPITAL_COMMUNITY)
Admission: RE | Disposition: A | Discharge status: Home / Self Care | Payer: Self-pay | Source: Home / Self Care | Attending: Orthopaedic Surgery

## 2021-09-29 ENCOUNTER — Other Ambulatory Visit: Payer: Self-pay

## 2021-09-29 ENCOUNTER — Observation Stay (HOSPITAL_COMMUNITY): Payer: BC Managed Care – PPO

## 2021-09-29 ENCOUNTER — Other Ambulatory Visit: Payer: Self-pay | Admitting: Physician Assistant

## 2021-09-29 ENCOUNTER — Ambulatory Visit (HOSPITAL_COMMUNITY): Payer: BC Managed Care – PPO | Admitting: Physician Assistant

## 2021-09-29 ENCOUNTER — Observation Stay (HOSPITAL_COMMUNITY)
Admission: RE | Admit: 2021-09-29 | Discharge: 2021-09-30 | Disposition: A | Discharge status: Home / Self Care | Payer: BC Managed Care – PPO | Attending: Orthopaedic Surgery | Admitting: Orthopaedic Surgery

## 2021-09-29 ENCOUNTER — Ambulatory Visit (HOSPITAL_COMMUNITY): Payer: BC Managed Care – PPO | Admitting: Vascular Surgery

## 2021-09-29 DIAGNOSIS — I10 Essential (primary) hypertension: Secondary | ICD-10-CM | POA: Insufficient documentation

## 2021-09-29 DIAGNOSIS — E119 Type 2 diabetes mellitus without complications: Secondary | ICD-10-CM | POA: Diagnosis not present

## 2021-09-29 DIAGNOSIS — M1611 Unilateral primary osteoarthritis, right hip: Secondary | ICD-10-CM | POA: Diagnosis present

## 2021-09-29 DIAGNOSIS — Z79899 Other long term (current) drug therapy: Secondary | ICD-10-CM | POA: Insufficient documentation

## 2021-09-29 DIAGNOSIS — Z7984 Long term (current) use of oral hypoglycemic drugs: Secondary | ICD-10-CM | POA: Insufficient documentation

## 2021-09-29 DIAGNOSIS — Z96641 Presence of right artificial hip joint: Secondary | ICD-10-CM

## 2021-09-29 DIAGNOSIS — Z96649 Presence of unspecified artificial hip joint: Secondary | ICD-10-CM

## 2021-09-29 DIAGNOSIS — Z419 Encounter for procedure for purposes other than remedying health state, unspecified: Secondary | ICD-10-CM

## 2021-09-29 HISTORY — PX: TOTAL HIP ARTHROPLASTY: SHX124

## 2021-09-29 LAB — GLUCOSE, CAPILLARY
Glucose-Capillary: 118 mg/dL — ABNORMAL HIGH (ref 70–99)
Glucose-Capillary: 144 mg/dL — ABNORMAL HIGH (ref 70–99)
Glucose-Capillary: 174 mg/dL — ABNORMAL HIGH (ref 70–99)
Glucose-Capillary: 224 mg/dL — ABNORMAL HIGH (ref 70–99)

## 2021-09-29 SURGERY — ARTHROPLASTY, HIP, TOTAL, ANTERIOR APPROACH
Anesthesia: General | Site: Hip | Laterality: Right

## 2021-09-29 MED ORDER — ACETAMINOPHEN 500 MG PO TABS
1000.0000 mg | ORAL_TABLET | Freq: Four times a day (QID) | ORAL | Status: DC
  Administered 2021-09-29 – 2021-09-30 (×2): 1000 mg via ORAL
  Filled 2021-09-29 (×2): qty 2

## 2021-09-29 MED ORDER — POVIDONE-IODINE 10 % EX SWAB
2.0000 "application " | Freq: Once | CUTANEOUS | Status: AC
  Administered 2021-09-29: 2 via TOPICAL

## 2021-09-29 MED ORDER — MEPERIDINE HCL 25 MG/ML IJ SOLN: 6.2500 mg | INTRAMUSCULAR | Status: DC | PRN

## 2021-09-29 MED ORDER — ONDANSETRON HCL 4 MG PO TABS: 4.0000 mg | ORAL_TABLET | Freq: Four times a day (QID) | ORAL | Status: DC | PRN

## 2021-09-29 MED ORDER — ASPIRIN 81 MG PO CHEW
81.0000 mg | CHEWABLE_TABLET | Freq: Two times a day (BID) | ORAL | Status: DC
  Administered 2021-09-29 – 2021-09-30 (×2): 81 mg via ORAL
  Filled 2021-09-29 (×2): qty 1

## 2021-09-29 MED ORDER — METHOCARBAMOL 1000 MG/10ML IJ SOLN
500.0000 mg | Freq: Four times a day (QID) | INTRAVENOUS | Status: DC | PRN
  Filled 2021-09-29: qty 5

## 2021-09-29 MED ORDER — ACETAMINOPHEN 160 MG/5ML PO SOLN: 325.0000 mg | ORAL | Status: DC | PRN

## 2021-09-29 MED ORDER — OXYCODONE HCL 5 MG/5ML PO SOLN: 5.0000 mg | Freq: Once | ORAL | Status: AC | PRN

## 2021-09-29 MED ORDER — ACETAMINOPHEN 325 MG PO TABS: 325.0000 mg | ORAL_TABLET | Freq: Four times a day (QID) | ORAL | Status: DC | PRN

## 2021-09-29 MED ORDER — BUPIVACAINE-MELOXICAM ER 400-12 MG/14ML IJ SOLN
INTRAMUSCULAR | Status: DC | PRN
  Administered 2021-09-29: 400 mg

## 2021-09-29 MED ORDER — LACTATED RINGERS IV SOLN: INTRAVENOUS | Status: DC

## 2021-09-29 MED ORDER — DEXAMETHASONE SODIUM PHOSPHATE 10 MG/ML IJ SOLN
INTRAMUSCULAR | Status: DC | PRN
Start: 2021-09-29 — End: 2021-09-29
  Administered 2021-09-29: 10 mg via INTRAVENOUS

## 2021-09-29 MED ORDER — METOCLOPRAMIDE HCL 5 MG/ML IJ SOLN: 5.0000 mg | Freq: Three times a day (TID) | INTRAMUSCULAR | Status: DC | PRN

## 2021-09-29 MED ORDER — CEPHALEXIN 500 MG PO CAPS: 500.0000 mg | ORAL_CAPSULE | Freq: Four times a day (QID) | ORAL | 0 refills | Status: AC

## 2021-09-29 MED ORDER — MENTHOL 3 MG MT LOZG: 1.0000 | LOZENGE | OROMUCOSAL | Status: DC | PRN

## 2021-09-29 MED ORDER — LISINOPRIL 10 MG PO TABS
10.0000 mg | ORAL_TABLET | Freq: Every day | ORAL | Status: DC
  Administered 2021-09-29 – 2021-09-30 (×2): 10 mg via ORAL
  Filled 2021-09-29 (×2): qty 1

## 2021-09-29 MED ORDER — HYDROMORPHONE HCL 1 MG/ML IJ SOLN
INTRAMUSCULAR | Status: AC
  Filled 2021-09-29: qty 0.5

## 2021-09-29 MED ORDER — OXYCODONE HCL 5 MG PO TABS
ORAL_TABLET | ORAL | Status: AC
  Filled 2021-09-29: qty 1

## 2021-09-29 MED ORDER — CEFAZOLIN SODIUM-DEXTROSE 2-4 GM/100ML-% IV SOLN
2.0000 g | Freq: Four times a day (QID) | INTRAVENOUS | Status: AC
  Administered 2021-09-29 (×2): 2 g via INTRAVENOUS
  Filled 2021-09-29 (×2): qty 100

## 2021-09-29 MED ORDER — SORBITOL 70 % SOLN: 30.0000 mL | Freq: Every day | Status: DC | PRN

## 2021-09-29 MED ORDER — POLYETHYLENE GLYCOL 3350 17 G PO PACK
17.0000 g | PACK | Freq: Every day | ORAL | Status: DC
  Administered 2021-09-30: 17 g via ORAL
  Filled 2021-09-29: qty 1

## 2021-09-29 MED ORDER — ALUM & MAG HYDROXIDE-SIMETH 200-200-20 MG/5ML PO SUSP: 30.0000 mL | ORAL | Status: DC | PRN

## 2021-09-29 MED ORDER — OXYCODONE HCL 5 MG PO TABS
5.0000 mg | ORAL_TABLET | Freq: Once | ORAL | Status: AC | PRN
  Administered 2021-09-29: 5 mg via ORAL

## 2021-09-29 MED ORDER — DIPHENHYDRAMINE HCL 12.5 MG/5ML PO ELIX
25.0000 mg | ORAL_SOLUTION | ORAL | Status: DC | PRN
  Filled 2021-09-29: qty 10

## 2021-09-29 MED ORDER — DIPHENHYDRAMINE HCL 50 MG/ML IJ SOLN
INTRAMUSCULAR | Status: DC | PRN
  Administered 2021-09-29: 25 mg via INTRAVENOUS

## 2021-09-29 MED ORDER — KETAMINE HCL 50 MG/5ML IJ SOSY
PREFILLED_SYRINGE | INTRAMUSCULAR | Status: AC
  Filled 2021-09-29: qty 5

## 2021-09-29 MED ORDER — METHOCARBAMOL 500 MG PO TABS
500.0000 mg | ORAL_TABLET | Freq: Four times a day (QID) | ORAL | Status: DC | PRN
  Administered 2021-09-29 – 2021-09-30 (×3): 500 mg via ORAL
  Filled 2021-09-29 (×3): qty 1

## 2021-09-29 MED ORDER — SODIUM CHLORIDE 0.9 % IV SOLN: INTRAVENOUS | Status: DC

## 2021-09-29 MED ORDER — ACETAMINOPHEN 500 MG PO TABS
ORAL_TABLET | ORAL | Status: AC
  Administered 2021-09-29: 1000 mg via ORAL
  Filled 2021-09-29: qty 2

## 2021-09-29 MED ORDER — ACETAMINOPHEN 325 MG PO TABS
ORAL_TABLET | ORAL | Status: AC
  Filled 2021-09-29: qty 2

## 2021-09-29 MED ORDER — ROCURONIUM BROMIDE 10 MG/ML (PF) SYRINGE
PREFILLED_SYRINGE | INTRAVENOUS | Status: DC | PRN
  Administered 2021-09-29 (×2): 50 mg via INTRAVENOUS

## 2021-09-29 MED ORDER — INSULIN ASPART 100 UNIT/ML IJ SOLN
0.0000 [IU] | Freq: Three times a day (TID) | INTRAMUSCULAR | Status: DC
  Administered 2021-09-30: 3 [IU] via SUBCUTANEOUS

## 2021-09-29 MED ORDER — OXYCODONE HCL ER 10 MG PO T12A
10.0000 mg | EXTENDED_RELEASE_TABLET | Freq: Two times a day (BID) | ORAL | Status: DC
  Administered 2021-09-29 – 2021-09-30 (×2): 10 mg via ORAL
  Filled 2021-09-29 (×2): qty 1

## 2021-09-29 MED ORDER — PANTOPRAZOLE SODIUM 40 MG PO TBEC
40.0000 mg | DELAYED_RELEASE_TABLET | Freq: Every day | ORAL | Status: DC
  Administered 2021-09-29 – 2021-09-30 (×2): 40 mg via ORAL
  Filled 2021-09-29 (×2): qty 1

## 2021-09-29 MED ORDER — KETAMINE HCL 10 MG/ML IJ SOLN
INTRAMUSCULAR | Status: DC | PRN
  Administered 2021-09-29: 30 mg via INTRAVENOUS
  Administered 2021-09-29 (×2): 10 mg via INTRAVENOUS

## 2021-09-29 MED ORDER — FENTANYL CITRATE (PF) 100 MCG/2ML IJ SOLN
25.0000 ug | INTRAMUSCULAR | Status: DC | PRN
  Administered 2021-09-29 (×3): 50 ug via INTRAVENOUS

## 2021-09-29 MED ORDER — VANCOMYCIN HCL 1000 MG IV SOLR
INTRAVENOUS | Status: AC
  Filled 2021-09-29: qty 20

## 2021-09-29 MED ORDER — INSULIN ASPART 100 UNIT/ML IJ SOLN
0.0000 [IU] | Freq: Every day | INTRAMUSCULAR | Status: DC
  Administered 2021-09-29: 2 [IU] via SUBCUTANEOUS

## 2021-09-29 MED ORDER — HYDROMORPHONE HCL 1 MG/ML IJ SOLN: 0.5000 mg | INTRAMUSCULAR | Status: DC | PRN

## 2021-09-29 MED ORDER — TRANEXAMIC ACID-NACL 1000-0.7 MG/100ML-% IV SOLN
1000.0000 mg | INTRAVENOUS | Status: AC
  Administered 2021-09-29: 1000 mg via INTRAVENOUS
  Filled 2021-09-29: qty 100

## 2021-09-29 MED ORDER — STERILE WATER FOR IRRIGATION IR SOLN
Status: DC | PRN
  Administered 2021-09-29: 1000 mL

## 2021-09-29 MED ORDER — GABAPENTIN 300 MG PO CAPS
ORAL_CAPSULE | ORAL | Status: AC
  Administered 2021-09-29: 600 mg
  Filled 2021-09-29: qty 2

## 2021-09-29 MED ORDER — PROPOFOL 10 MG/ML IV BOLUS
INTRAVENOUS | Status: DC | PRN
  Administered 2021-09-29: 200 mg via INTRAVENOUS
  Administered 2021-09-29: 50 mg via INTRAVENOUS

## 2021-09-29 MED ORDER — ONDANSETRON HCL 4 MG/2ML IJ SOLN: 4.0000 mg | Freq: Once | INTRAMUSCULAR | Status: DC | PRN

## 2021-09-29 MED ORDER — DOCUSATE SODIUM 100 MG PO CAPS
100.0000 mg | ORAL_CAPSULE | Freq: Two times a day (BID) | ORAL | Status: DC
  Administered 2021-09-29 – 2021-09-30 (×2): 100 mg via ORAL
  Filled 2021-09-29 (×2): qty 1

## 2021-09-29 MED ORDER — CEFAZOLIN SODIUM-DEXTROSE 2-4 GM/100ML-% IV SOLN
2.0000 g | INTRAVENOUS | Status: AC
  Administered 2021-09-29: 2 g via INTRAVENOUS

## 2021-09-29 MED ORDER — OXYCODONE HCL 5 MG PO TABS
10.0000 mg | ORAL_TABLET | ORAL | Status: DC | PRN
  Administered 2021-09-29: 15 mg via ORAL
  Filled 2021-09-29: qty 2
  Filled 2021-09-29: qty 3

## 2021-09-29 MED ORDER — LABETALOL HCL 5 MG/ML IV SOLN
INTRAVENOUS | Status: AC
  Filled 2021-09-29: qty 4

## 2021-09-29 MED ORDER — PHENOL 1.4 % MT LIQD: 1.0000 | OROMUCOSAL | Status: DC | PRN

## 2021-09-29 MED ORDER — ONDANSETRON HCL 4 MG/2ML IJ SOLN
INTRAMUSCULAR | Status: DC | PRN
  Administered 2021-09-29: 4 mg via INTRAVENOUS

## 2021-09-29 MED ORDER — ACETAMINOPHEN 500 MG PO TABS: 1000.0000 mg | ORAL_TABLET | ORAL | Status: AC

## 2021-09-29 MED ORDER — FENTANYL CITRATE (PF) 100 MCG/2ML IJ SOLN
INTRAMUSCULAR | Status: AC
  Filled 2021-09-29: qty 2

## 2021-09-29 MED ORDER — FENTANYL CITRATE (PF) 250 MCG/5ML IJ SOLN
INTRAMUSCULAR | Status: DC | PRN
  Administered 2021-09-29: 100 ug via INTRAVENOUS
  Administered 2021-09-29: 150 ug via INTRAVENOUS

## 2021-09-29 MED ORDER — TRANEXAMIC ACID-NACL 1000-0.7 MG/100ML-% IV SOLN
1000.0000 mg | Freq: Once | INTRAVENOUS | Status: AC
  Administered 2021-09-29: 1000 mg via INTRAVENOUS
  Filled 2021-09-29: qty 100

## 2021-09-29 MED ORDER — TEMAZEPAM 15 MG PO CAPS
30.0000 mg | ORAL_CAPSULE | Freq: Every evening | ORAL | Status: DC | PRN
  Administered 2021-09-30: 30 mg via ORAL
  Filled 2021-09-29: qty 1

## 2021-09-29 MED ORDER — LABETALOL HCL 5 MG/ML IV SOLN
5.0000 mg | Freq: Once | INTRAVENOUS | Status: AC
Start: 2021-09-29 — End: 2021-09-29
  Administered 2021-09-29: 5 mg via INTRAVENOUS

## 2021-09-29 MED ORDER — MIDAZOLAM HCL 2 MG/2ML IJ SOLN
INTRAMUSCULAR | Status: DC | PRN
  Administered 2021-09-29: 2 mg via INTRAVENOUS

## 2021-09-29 MED ORDER — ORAL CARE MOUTH RINSE: 15.0000 mL | Freq: Once | OROMUCOSAL | Status: AC

## 2021-09-29 MED ORDER — SODIUM CHLORIDE 0.9 % IR SOLN
Status: DC | PRN
Start: 2021-09-29 — End: 2021-09-29
  Administered 2021-09-29: 1000 mL

## 2021-09-29 MED ORDER — CHLORHEXIDINE GLUCONATE 0.12 % MT SOLN
15.0000 mL | Freq: Once | OROMUCOSAL | Status: AC
  Administered 2021-09-29: 15 mL via OROMUCOSAL
  Filled 2021-09-29: qty 15

## 2021-09-29 MED ORDER — CELECOXIB 200 MG PO CAPS
ORAL_CAPSULE | ORAL | Status: AC
  Administered 2021-09-29: 200 mg via ORAL
  Filled 2021-09-29: qty 1

## 2021-09-29 MED ORDER — OXYCODONE HCL 5 MG PO TABS
5.0000 mg | ORAL_TABLET | ORAL | Status: DC | PRN
  Administered 2021-09-29 – 2021-09-30 (×4): 10 mg via ORAL
  Filled 2021-09-29 (×3): qty 2

## 2021-09-29 MED ORDER — HYDROMORPHONE HCL 1 MG/ML IJ SOLN
INTRAMUSCULAR | Status: DC | PRN
  Administered 2021-09-29 (×4): .5 mg via INTRAVENOUS

## 2021-09-29 MED ORDER — ACETAMINOPHEN 325 MG PO TABS
325.0000 mg | ORAL_TABLET | ORAL | Status: DC | PRN
  Administered 2021-09-29: 650 mg via ORAL

## 2021-09-29 MED ORDER — GABAPENTIN 600 MG PO TABS
600.0000 mg | ORAL_TABLET | Freq: Three times a day (TID) | ORAL | Status: DC
  Administered 2021-09-29 – 2021-09-30 (×2): 600 mg via ORAL
  Filled 2021-09-29 (×2): qty 1

## 2021-09-29 MED ORDER — METOCLOPRAMIDE HCL 5 MG PO TABS: 5.0000 mg | ORAL_TABLET | Freq: Three times a day (TID) | ORAL | Status: DC | PRN

## 2021-09-29 MED ORDER — MIDAZOLAM HCL 2 MG/2ML IJ SOLN
INTRAMUSCULAR | Status: AC
  Filled 2021-09-29: qty 2

## 2021-09-29 MED ORDER — VANCOMYCIN HCL 1 G IV SOLR
INTRAVENOUS | Status: DC | PRN
  Administered 2021-09-29: 1000 mg

## 2021-09-29 MED ORDER — DEXAMETHASONE SODIUM PHOSPHATE 10 MG/ML IJ SOLN
10.0000 mg | Freq: Once | INTRAMUSCULAR | Status: AC
  Administered 2021-09-30: 10 mg via INTRAVENOUS
  Filled 2021-09-29: qty 1

## 2021-09-29 MED ORDER — CELECOXIB 200 MG PO CAPS
200.0000 mg | ORAL_CAPSULE | Freq: Once | ORAL | Status: AC
Start: 2021-09-29 — End: 2021-09-29

## 2021-09-29 MED ORDER — ONDANSETRON HCL 4 MG/2ML IJ SOLN: 4.0000 mg | Freq: Four times a day (QID) | INTRAMUSCULAR | Status: DC | PRN

## 2021-09-29 MED ORDER — GABAPENTIN 600 MG PO TABS: 600.0000 mg | ORAL_TABLET | ORAL | Status: DC

## 2021-09-29 MED ORDER — HYDROMORPHONE HCL 1 MG/ML IJ SOLN
INTRAMUSCULAR | Status: AC
  Filled 2021-09-29: qty 1

## 2021-09-29 MED ORDER — HYDROMORPHONE HCL 1 MG/ML IJ SOLN
0.2500 mg | INTRAMUSCULAR | Status: DC | PRN
  Administered 2021-09-29 (×2): 0.5 mg via INTRAVENOUS

## 2021-09-29 MED ORDER — SUGAMMADEX SODIUM 200 MG/2ML IV SOLN
INTRAVENOUS | Status: DC | PRN
  Administered 2021-09-29: 200 mg via INTRAVENOUS

## 2021-09-29 MED ORDER — METFORMIN HCL ER 500 MG PO TB24
500.0000 mg | ORAL_TABLET | Freq: Two times a day (BID) | ORAL | Status: DC
  Administered 2021-09-29 – 2021-09-30 (×2): 500 mg via ORAL
  Filled 2021-09-29 (×2): qty 1

## 2021-09-29 MED ORDER — 0.9 % SODIUM CHLORIDE (POUR BTL) OPTIME
TOPICAL | Status: DC | PRN
  Administered 2021-09-29: 1000 mL

## 2021-09-29 MED ORDER — BUPIVACAINE-MELOXICAM ER 400-12 MG/14ML IJ SOLN
INTRAMUSCULAR | Status: AC
  Filled 2021-09-29: qty 1

## 2021-09-29 MED ORDER — FENTANYL CITRATE (PF) 250 MCG/5ML IJ SOLN
INTRAMUSCULAR | Status: AC
  Filled 2021-09-29: qty 5

## 2021-09-29 SURGICAL SUPPLY — 66 items
ADH SKN CLS APL DERMABOND .7 (GAUZE/BANDAGES/DRESSINGS) ×1
BAG COUNTER SPONGE SURGICOUNT (BAG) ×3 IMPLANT
BAG DECANTER FOR FLEXI CONT (MISCELLANEOUS) ×3 IMPLANT
BAG SPNG CNTER NS LX DISP (BAG) ×1
CELLS DAT CNTRL 66122 CELL SVR (MISCELLANEOUS) IMPLANT
COVER PERINEAL POST (MISCELLANEOUS) ×3 IMPLANT
COVER SURGICAL LIGHT HANDLE (MISCELLANEOUS) ×3 IMPLANT
DERMABOND ADVANCED (GAUZE/BANDAGES/DRESSINGS) ×1
DERMABOND ADVANCED .7 DNX12 (GAUZE/BANDAGES/DRESSINGS) IMPLANT
DRAPE C-ARM 42X72 X-RAY (DRAPES) ×3 IMPLANT
DRAPE POUCH INSTRU U-SHP 10X18 (DRAPES) ×3 IMPLANT
DRAPE STERI IOBAN 125X83 (DRAPES) ×3 IMPLANT
DRAPE U-SHAPE 47X51 STRL (DRAPES) ×6 IMPLANT
DRESSING AQUACEL AG SP 3.5X10 (GAUZE/BANDAGES/DRESSINGS) IMPLANT
DRSG AQUACEL AG ADV 3.5X10 (GAUZE/BANDAGES/DRESSINGS) ×3 IMPLANT
DRSG AQUACEL AG SP 3.5X10 (GAUZE/BANDAGES/DRESSINGS) ×2
DURAPREP 26ML APPLICATOR (WOUND CARE) ×6 IMPLANT
ELECT BLADE 4.0 EZ CLEAN MEGAD (MISCELLANEOUS) ×2
ELECT REM PT RETURN 9FT ADLT (ELECTROSURGICAL) ×2
ELECTRODE BLDE 4.0 EZ CLN MEGD (MISCELLANEOUS) ×2 IMPLANT
ELECTRODE REM PT RTRN 9FT ADLT (ELECTROSURGICAL) ×2 IMPLANT
GLOVE BIOGEL PI IND STRL 7.5 (GLOVE) ×8 IMPLANT
GLOVE BIOGEL PI INDICATOR 7.5 (GLOVE) ×4
GLOVE SURG LTX SZ7 (GLOVE) ×6 IMPLANT
GLOVE SURG UNDER POLY LF SZ7 (GLOVE) ×6 IMPLANT
GLOVE SURG UNDER POLY LF SZ7.5 (GLOVE) ×6 IMPLANT
GOWN STRL REIN XL XLG (GOWN DISPOSABLE) ×3 IMPLANT
GOWN STRL REUS W/ TWL LRG LVL3 (GOWN DISPOSABLE) IMPLANT
GOWN STRL REUS W/ TWL XL LVL3 (GOWN DISPOSABLE) ×2 IMPLANT
GOWN STRL REUS W/TWL LRG LVL3 (GOWN DISPOSABLE)
GOWN STRL REUS W/TWL XL LVL3 (GOWN DISPOSABLE) ×2
HANDPIECE INTERPULSE COAX TIP (DISPOSABLE) ×2
HEAD CERAMIC DELTA 36 PLUS 1.5 (Hips) ×1 IMPLANT
HOOD PEEL AWAY FLYTE STAYCOOL (MISCELLANEOUS) ×6 IMPLANT
IV NS IRRIG 3000ML ARTHROMATIC (IV SOLUTION) ×3 IMPLANT
JET LAVAGE IRRISEPT WOUND (IRRIGATION / IRRIGATOR) ×2
KIT BASIN OR (CUSTOM PROCEDURE TRAY) ×3 IMPLANT
LAVAGE JET IRRISEPT WOUND (IRRIGATION / IRRIGATOR) ×2 IMPLANT
LINER NEUTRAL 52X36MM PLUS 4 (Liner) ×1 IMPLANT
MARKER SKIN DUAL TIP RULER LAB (MISCELLANEOUS) ×3 IMPLANT
NDL SPNL 18GX3.5 QUINCKE PK (NEEDLE) ×2 IMPLANT
NEEDLE SPNL 18GX3.5 QUINCKE PK (NEEDLE) ×2 IMPLANT
PACK TOTAL JOINT (CUSTOM PROCEDURE TRAY) ×3 IMPLANT
PACK UNIVERSAL I (CUSTOM PROCEDURE TRAY) ×3 IMPLANT
PIN SECTOR W/GRIP ACE CUP 52MM (Hips) ×1 IMPLANT
RETRACTOR WND ALEXIS 18 MED (MISCELLANEOUS) IMPLANT
RTRCTR WOUND ALEXIS 18CM MED (MISCELLANEOUS)
SAW OSC TIP CART 19.5X105X1.3 (SAW) ×3 IMPLANT
SCREW 6.5MMX25MM (Screw) ×1 IMPLANT
SET HNDPC FAN SPRY TIP SCT (DISPOSABLE) ×2 IMPLANT
STAPLER VISISTAT 35W (STAPLE) IMPLANT
STEM FEMORAL SZ5 HIGH ACTIS (Stem) ×1 IMPLANT
SUT ETHIBOND 2 V 37 (SUTURE) ×3 IMPLANT
SUT ETHILON 2 0 FS 18 (SUTURE) ×3 IMPLANT
SUT VIC AB 0 CT1 27 (SUTURE) ×2
SUT VIC AB 0 CT1 27XBRD ANBCTR (SUTURE) ×2 IMPLANT
SUT VIC AB 1 CTX 36 (SUTURE) ×2
SUT VIC AB 1 CTX36XBRD ANBCTR (SUTURE) ×2 IMPLANT
SUT VIC AB 2-0 CT1 27 (SUTURE) ×4
SUT VIC AB 2-0 CT1 TAPERPNT 27 (SUTURE) ×4 IMPLANT
SYR 50ML LL SCALE MARK (SYRINGE) ×3 IMPLANT
TOWEL GREEN STERILE (TOWEL DISPOSABLE) ×3 IMPLANT
TRAY CATH 16FR W/PLASTIC CATH (SET/KITS/TRAYS/PACK) IMPLANT
TRAY FOLEY W/BAG SLVR 16FR (SET/KITS/TRAYS/PACK) ×2
TRAY FOLEY W/BAG SLVR 16FR ST (SET/KITS/TRAYS/PACK) ×2 IMPLANT
YANKAUER SUCT BULB TIP NO VENT (SUCTIONS) ×3 IMPLANT

## 2021-09-29 NOTE — Discharge Instructions (Signed)

## 2021-09-29 NOTE — Transfer of Care (Signed)
Immediate Anesthesia Transfer of Care Note  Patient: Jacob Bates  Procedure(s) Performed: RIGHT TOTAL HIP ARTHROPLASTY ANTERIOR APPROACH (Right: Hip)  Patient Location: PACU  Anesthesia Type:General  Level of Consciousness: awake, alert  and oriented  Airway & Oxygen Therapy: Patient Spontanous Breathing and Patient connected to face mask oxygen  Post-op Assessment: Report given to RN and Post -op Vital signs reviewed and stable  Post vital signs: Reviewed and stable  Last Vitals:  Vitals Value Taken Time  BP 156/68 09/29/21 1515  Temp 37.3 C 09/29/21 1515  Pulse 93 09/29/21 1520  Resp 14 09/29/21 1520  SpO2 96 % 09/29/21 1520  Vitals shown include unvalidated device data.  Last Pain:  Vitals:   09/29/21 1515  PainSc: Asleep      Patients Stated Pain Goal: 0 ([id number])  Complications: No notable events documented.

## 2021-09-29 NOTE — Anesthesia Postprocedure Evaluation (Signed)
Anesthesia Post Note  Patient: Jacob Bates  Procedure(s) Performed: RIGHT TOTAL HIP ARTHROPLASTY ANTERIOR APPROACH (Right: Hip)     Patient location during evaluation: PACU Anesthesia Type: General Level of consciousness: awake and alert Pain management: pain level controlled Vital Signs Assessment: post-procedure vital signs reviewed and stable Respiratory status: spontaneous breathing, nonlabored ventilation, respiratory function stable and patient connected to nasal cannula oxygen Cardiovascular status: blood pressure returned to baseline and stable Postop Assessment: no apparent nausea or vomiting Anesthetic complications: no   No notable events documented.  Last Vitals:  Vitals:   09/29/21 1515 09/29/21 1530  BP: (!) 156/68 (!) 148/86  Pulse: 93 93  Resp: 12 17  Temp: 37.3 C   SpO2: 100% 97%    Last Pain:  Vitals:   09/29/21 1515  PainSc: Asleep                 Delwyn Scoggin

## 2021-09-29 NOTE — Op Note (Signed)
RIGHT TOTAL HIP ARTHROPLASTY ANTERIOR APPROACH  Procedure Note Jamin Panther   [id number]  Pre-op Diagnosis: RIGHT HIP DEGENERATIVE JOINT DISEASE     Post-op Diagnosis: same   Operative Procedures  1. Total hip replacement; Right hip; uncemented [id number]   Surgeon: Gershon Mussel, M.D.  Assist: Oneal Grout, PA-C   Anesthesia: general, local  Prosthesis: Depuy Acetabulum: Pinnacle 52 mm Femur: Actis 5 HO Head: 36 mm size: +1.5 Liner: +4 Bearing Type: ceramic/poly  Total Hip Arthroplasty (Anterior Approach) Op Note:  After informed consent was obtained and the operative extremity marked in the holding area, the patient was brought back to the operating room and placed supine on the HANA table. Next, the operative extremity was prepped and draped in normal sterile fashion. Surgical timeout occurred verifying patient identification, surgical site, surgical procedure and administration of antibiotics.  A modified anterior Smith-Peterson approach to the hip was performed, using the interval between tensor fascia lata and sartorius.  Dissection was carried bluntly down onto the anterior hip capsule. The lateral femoral circumflex vessels were identified and coagulated. A capsulotomy was performed and the capsular flaps tagged for later repair.  The neck osteotomy was performed. The femoral head was removed which showed severe wear, the acetabular rim was cleared of soft tissue and attention was turned to reaming the acetabulum.  Sequential reaming was performed under fluoroscopic guidance. We reamed to a size 51 mm, and then impacted the acetabular shell. A 25 mm cancellous screw was placed through the shell for added fixation.  The liner was then placed after irrigation and attention turned to the femur.  After placing the femoral hook, the leg was taken to externally rotated, extended and adducted position taking care to perform soft tissue releases to allow for adequate mobilization of  the femur. Soft tissue was cleared from the shoulder of the greater trochanter and the hook elevator used to improve exposure of the proximal femur. Sequential broaching performed up to a size 5. Trial neck and head were placed. The leg was brought back up to neutral and the construct reduced.  Antibiotic irrigation was placed in the surgical wound and kept for at least 1 minute.  The position and sizing of components, offset and leg lengths were checked using fluoroscopy. Stability of the construct was checked in extension and external rotation without any subluxation or impingement of prosthesis. We dislocated the prosthesis, dropped the leg back into position, removed trial components, and irrigated copiously. The final stem and head was then placed, the leg brought back up, the system reduced and fluoroscopy used to verify positioning.  We irrigated, obtained hemostasis and closed the capsule using #2 ethibond suture.  One gram of vancomycin powder was placed in the surgical bed.   One gram of topical tranexamic acid was injected into the joint.  The fascia was closed with #1 vicryl plus, the deep fat layer was closed with 0 vicryl, the subcutaneous layers closed with 2.0 Vicryl Plus and the skin closed with 2.0 nylon and dermabond. A sterile dressing was applied. The patient was awakened in the operating room and taken to recovery in stable condition.  All sponge, needle, and instrument counts were correct at the end of the case.   Tessa Lerner, my PA, was a medical necessity for opening, closing, limb positioning, retracting, exposing, and overall facilitation and timely completion of the surgery.  Position: supine  Complications: see description of procedure.  Time Out: performed   Drains/Packing: none  Estimated blood  loss: see anesthesia record  Returned to Recovery Room: in good condition.   Antibiotics: yes   Mechanical VTE (DVT) Prophylaxis: sequential compression devices, TED  thigh-high  Chemical VTE (DVT) Prophylaxis: aspirin   Fluid Replacement: see anesthesia record  Specimens Removed: 1 to pathology   Sponge and Instrument Count Correct? yes   PACU: portable radiograph - low AP   Plan/RTC: Return in 2 weeks for staple removal. Weight Bearing/Load Lower Extremity: full  Hip precautions: none Suture Removal: 2 weeks   N. Glee Arvin, MD Aldean Baker 2:45 PM   Implant Name Type Inv. Item Serial No. Manufacturer Lot No. LRB No. Used Action  PIN SECTOR W/GRIP ACE CUP - [id number] Hips PIN SECTOR W/GRIP ACE CUP  [employer] [id number] Right 1 Implanted  LINER NEUTRAL 52X36MM PLUS 4 - [id number] Liner LINER NEUTRAL 52X36MM PLUS 4  [employer] [id number] Right 1 Implanted  SCREW 6.5MMX25MM - [id number] Screw SCREW 6.5MMX25MM  [employer] [id number] Right 1 Implanted  HEAD CERAMIC DELTA 36 PLUS 1.5 - [id number] Hips HEAD CERAMIC DELTA 36 PLUS 1.5  [employer] [id number] Right 1 Implanted  STEM FEMORAL SZ5 HIGH ACTIS - [id number] Stem STEM FEMORAL SZ5 HIGH ACTIS  [employer] [id number] Right 1 Implanted

## 2021-09-29 NOTE — H&P (Signed)
PREOPERATIVE H&P  Chief Complaint: RIGHT HIP DEGENERATIVE JOINT DISEASE  HPI: Jacob Bates is a 61 y.o. male who presents for surgical treatment of RIGHT HIP DEGENERATIVE JOINT DISEASE.  He denies any changes in medical history.  Past Medical History:  Diagnosis Date   Anxiety    Arthritis    Diabetes mellitus without complication (HCC)    Hypertension    Past Surgical History:  Procedure Laterality Date   BACK SURGERY     KNEE SURGERY Right    ROTATOR CUFF REPAIR     Social History   Socioeconomic History   Marital status: Married    Spouse name: Not on file   Number of children: Not on file   Years of education: Not on file   Highest education level: Not on file  Occupational History   Not on file  Tobacco Use   Smoking status: Never   Smokeless tobacco: Not on file  Vaping Use   Vaping Use: Never used  Substance and Sexual Activity   Alcohol use: No   Drug use: Yes    Types: Other-see comments    Comment: CBD   Sexual activity: Not on file  Other Topics Concern   Not on file  Social History Narrative   Not on file   Social Determinants of Health   Financial Resource Strain: Not on file  Food Insecurity: Not on file  Transportation Needs: Not on file  Physical Activity: Not on file  Stress: Not on file  Social Connections: Not on file   History reviewed. No pertinent family history. Allergies  Allergen Reactions   Codeine Itching   Sulfa Antibiotics Nausea And Vomiting   Prior to Admission medications   Medication Sig Start Date End Date Taking? Authorizing Provider  acetaminophen (TYLENOL) 500 MG tablet Take 1,000 mg by mouth every 6 (six) hours as needed for headache.   Yes [provider]  aspirin EC 81 MG tablet Take 1 tablet (81 mg total) by mouth 2 (two) times daily. To be taken after surgery to prevent blood clots 09/22/21   Cristie Hem, PA-C  Cyanocobalamin (B-12 PO) Take 1 tablet by mouth daily.   Yes [provider]  diphenhydrAMINE (BENADRYL) 25 MG tablet Take 50 mg by mouth daily as needed for allergies.   Yes [provider]  docusate sodium (COLACE) 100 MG capsule Take 1 capsule (100 mg total) by mouth daily as needed. 09/22/21 09/22/22  Cristie Hem, PA-C  esomeprazole (NEXIUM) 40 MG capsule Take 40 mg by mouth daily at 12 noon.   Yes [provider]  fluticasone (FLONASE) 50 MCG/ACT nasal spray Place 1 spray into both nostrils 2 (two) times daily as needed for allergies. 08/14/21  Yes [provider]  gabapentin (NEURONTIN) 600 MG tablet Take 600 mg by mouth 3 (three) times daily.   Yes [provider]  lisinopril (PRINIVIL,ZESTRIL) 10 MG tablet Take 10 mg by mouth daily. 02/01/14  Yes [provider]  Magnesium 400 MG TABS Take 800 mg by mouth daily.   Yes [provider]  metFORMIN (GLUCOPHAGE-XR) 500 MG 24 hr tablet Take 500 mg by mouth 2 (two) times daily. 12/06/13  Yes [provider]  methocarbamol (ROBAXIN) 500 MG tablet Take 1 tablet (500 mg total) by mouth 2 (two) times daily as needed. 09/22/21   Cristie Hem, PA-C  ondansetron (ZOFRAN) 4 MG tablet Take 1 tablet (4 mg total) by mouth every 8 (eight)  hours as needed for nausea or vomiting. 09/22/21   Cristie Hem, PA-C  oxycodone (ROXICODONE) 30 MG immediate release tablet Take 30 mg by mouth 4 (four) times daily as needed for pain. 08/15/21  Yes [provider]  oxyCODONE-acetaminophen (PERCOCET) 5-325 MG tablet Take 1-2 tablets by mouth every 6 (six) hours as needed. To be taken after surgery 09/22/21   Cristie Hem, PA-C  Pitavastatin Calcium (LIVALO) 4 MG TABS Take 2 mg by mouth daily.   Yes [provider]  temazepam (RESTORIL) 30 MG capsule Take 30 mg by mouth at bedtime as needed for sleep. 08/14/21  Yes [provider]     Positive ROS: All other systems have been reviewed and were otherwise negative with the exception of those mentioned  in the HPI and as above.  Physical Exam: General: Alert, no acute distress Cardiovascular: No pedal edema Respiratory: No cyanosis, no use of accessory musculature GI: abdomen soft Skin: No lesions in the area of chief complaint Neurologic: Sensation intact distally Psychiatric: Patient is competent for consent with normal mood and affect Lymphatic: no lymphedema  MUSCULOSKELETAL: exam stable  Assessment: RIGHT HIP DEGENERATIVE JOINT DISEASE  Plan: Plan for Procedure(s): RIGHT TOTAL HIP ARTHROPLASTY ANTERIOR APPROACH  The risks benefits and alternatives were discussed with the patient including but not limited to the risks of nonoperative treatment, versus surgical intervention including infection, bleeding, nerve injury,  blood clots, cardiopulmonary complications, morbidity, mortality, among others, and they were willing to proceed.   Preoperative templating of the joint replacement has been completed, documented, and submitted to the Operating Room personnel in order to optimize intra-operative equipment management.   Glee Arvin, MD 09/29/2021 12:00 PM

## 2021-09-29 NOTE — Anesthesia Procedure Notes (Signed)
Procedure Name: Intubation Date/Time: 09/29/2021 1:15 PM Performed by: Griffin Dakin, CRNA Pre-anesthesia Checklist: Patient identified, Emergency Drugs available, Suction available and Patient being monitored Patient Re-evaluated:Patient Re-evaluated prior to induction Oxygen Delivery Method: Circle system utilized Preoxygenation: Pre-oxygenation with 100% oxygen Induction Type: IV induction Ventilation: Oral airway inserted - appropriate to patient size and Two handed mask ventilation required Laryngoscope Size: Glidescope and 4 Grade View: Grade I Tube type: Oral Tube size: 7.5 mm Number of attempts: 1 Airway Equipment and Method: Rigid stylet and Video-laryngoscopy Placement Confirmation: ETT inserted through vocal cords under direct vision, positive ETCO2 and breath sounds checked- equal and bilateral Secured at: 23 cm Tube secured with: Tape Dental Injury: Teeth and Oropharynx as per pre-operative assessment

## 2021-09-30 ENCOUNTER — Encounter (HOSPITAL_COMMUNITY): Payer: Self-pay | Admitting: Orthopaedic Surgery

## 2021-09-30 DIAGNOSIS — M1611 Unilateral primary osteoarthritis, right hip: Secondary | ICD-10-CM | POA: Diagnosis not present

## 2021-09-30 LAB — BASIC METABOLIC PANEL
Anion gap: 11 (ref 5–15)
BUN: 8 mg/dL (ref 8–23)
CO2: 28 mmol/L (ref 22–32)
Calcium: 9.1 mg/dL (ref 8.9–10.3)
Chloride: 102 mmol/L (ref 98–111)
Creatinine, Ser: 0.88 mg/dL (ref 0.61–1.24)
GFR, Estimated: >60 mL/min (ref >60)
Glucose, Bld: 143 mg/dL — ABNORMAL HIGH (ref 70–99)
Potassium: 4.4 mmol/L (ref 3.5–5.1)
Sodium: 141 mmol/L (ref 135–145)

## 2021-09-30 LAB — GLUCOSE, CAPILLARY
Glucose-Capillary: 139 mg/dL — ABNORMAL HIGH (ref 70–99)
Glucose-Capillary: 89 mg/dL (ref 70–99)

## 2021-09-30 LAB — CBC
HCT: 37.7 % — ABNORMAL LOW (ref 39.0–52.0)
Hemoglobin: 12.4 g/dL — ABNORMAL LOW (ref 13.0–17.0)
MCH: 28.8 pg (ref 26.0–34.0)
MCHC: 32.9 g/dL (ref 30.0–36.0)
MCV: 87.7 fL (ref 80.0–100.0)
Platelets: 315 10*3/uL (ref 150–400)
RBC: 4.3 MIL/uL (ref 4.22–5.81)
RDW: 14.5 % (ref 11.5–15.5)
WBC: 21.6 10*3/uL — ABNORMAL HIGH (ref 4.0–10.5)
nRBC: 0 % (ref 0.0–0.2)

## 2021-09-30 NOTE — Progress Notes (Signed)
Subjective: 1 Day Post-Op Procedure(s) (LRB): RIGHT TOTAL HIP ARTHROPLASTY ANTERIOR APPROACH (Right) Patient reports pain as mild.  Feeling much better today  Objective: Vital signs in last 24 hours: Temp:  [97.9 F (36.6 C)-99.1 F (37.3 C)] 98.3 F (36.8 C) (01/31 0739) Pulse Rate:  [86-110] 88 (01/31 0739) Resp:  [11-20] 16 (01/31 0739) BP: (121-189)/(59-98) 121/59 (01/31 0739) SpO2:  [92 %-100 %] 96 % (01/31 0739) Weight:  [95.3 kg] 95.3 kg (01/30 1031)  Intake/Output from previous day: 01/30 0701 - 01/31 0700 In: 1340 [P.O.:240; I.V.:1000] Out: -  Intake/Output this shift: No intake/output data recorded.  Recent Labs    09/30/21 0549  HGB 12.4*   Recent Labs    09/30/21 0549  WBC 21.6*  RBC 4.30  HCT 37.7*  PLT 315   Recent Labs    09/30/21 0549  NA 141  K 4.4  CL 102  CO2 28  BUN 8  CREATININE 0.88  GLUCOSE 143*  CALCIUM 9.1   No results for input(s): LABPT, INR in the last 72 hours.  Neurologically intact Neurovascular intact Sensation intact distally Intact pulses distally Dorsiflexion/Plantar flexion intact Incision: dressing C/D/I No cellulitis present Compartment soft   Assessment/Plan: 1 Day Post-Op Procedure(s) (LRB): RIGHT TOTAL HIP ARTHROPLASTY ANTERIOR APPROACH (Right) Advance diet Up with therapy D/C IV fluids WBAT RLE ABLA- mild and stable D/c after first PT session- no hhpt needed.  Please provide patient with exercise handout prior to d/c     Cristie Hem 09/30/2021, 8:02 AM

## 2021-09-30 NOTE — Discharge Summary (Signed)
Patient ID: Jacob Bates MRN: [id number] DOB/AGE: [birth year] 61 y.o.  Admit date: 09/29/2021 Discharge date: 09/30/2021  Admission Diagnoses:  Principal Problem:   Primary osteoarthritis of right hip Active Problems:   Status post total replacement of right hip   Discharge Diagnoses:  Same  Past Medical History:  Diagnosis Date   Anxiety    Arthritis    Diabetes mellitus without complication ([place])    Hypertension     Surgeries: Procedure(s): RIGHT TOTAL HIP ARTHROPLASTY ANTERIOR APPROACH on 09/29/2021   Consultants:   Discharged Condition: Improved  Hospital Course: Jacob Bates is an 61 y.o. male who was admitted 09/29/2021 for operative treatment ofPrimary osteoarthritis of right hip. Patient has severe unremitting pain that affects sleep, daily activities, and work/hobbies. After pre-op clearance the patient was taken to the operating room on 09/29/2021 and underwent  Procedure(s): RIGHT TOTAL HIP ARTHROPLASTY ANTERIOR APPROACH.    Patient was given perioperative antibiotics:  Anti-infectives (From admission, onward)    Start     Dose/Rate Route Frequency Ordered Stop   09/29/21 1815  ceFAZolin (ANCEF) IVPB 2g/100 mL premix        2 g 200 mL/hr over 30 Minutes Intravenous Every 6 hours 09/29/21 1808 09/29/21 2332   09/29/21 1448  vancomycin (VANCOCIN) powder  Status:  Discontinued          As needed 09/29/21 1449 09/29/21 1510   09/29/21 1030  ceFAZolin (ANCEF) IVPB 2g/100 mL premix        2 g 200 mL/hr over 30 Minutes Intravenous On call to O.R. 09/29/21 1028 09/29/21 1331        Patient was given sequential compression devices, early ambulation, and chemoprophylaxis to prevent DVT.  Patient benefited maximally from hospital stay and there were no complications.    Recent vital signs: Patient Vitals for the past 24 hrs:  BP Temp Temp src Pulse Resp SpO2 Height Weight  09/30/21 0739 (!) 121/59 98.3 F (36.8 C) Oral 88 16 96 % -- --  09/30/21 0515 (!) 164/83 97.9 F  (36.6 C) Oral 86 20 99 % -- --  09/30/21 0003 (!) 159/74 98.4 F (36.9 C) Oral 99 20 95 % -- --  09/29/21 2042 (!) 164/62 98.7 F (37.1 C) Oral (!) 109 20 96 % -- --  09/29/21 1821 (!) 167/98 98.5 F (36.9 C) Oral 94 16 97 % -- --  09/29/21 1745 (!) 159/98 99.1 F (37.3 C) -- (!) 105 14 96 % -- --  09/29/21 1730 (!) 159/98 -- -- (!) 101 14 96 % -- --  09/29/21 1715 (!) 175/90 -- -- 97 11 95 % -- --  09/29/21 1700 (!) 181/92 -- -- (!) 107 12 93 % -- --  09/29/21 1645 (!) 181/85 -- -- (!) 110 14 94 % -- --  09/29/21 1630 (!) 183/98 -- -- 100 13 95 % -- --  09/29/21 1616 (!) 179/93 -- -- 95 13 92 % -- --  09/29/21 1600 (!) 178/92 -- -- 90 13 94 % -- --  09/29/21 1545 (!) 166/89 -- -- 93 16 98 % -- --  09/29/21 1530 (!) 148/86 -- -- 93 17 97 % -- --  09/29/21 1515 (!) 156/68 99.1 F (37.3 C) -- 93 12 100 % -- --  09/29/21 1031 (!) 189/91 98.1 F (36.7 C) -- 95 18 94 % 5\' 6"  (1.676 m) 95.3 kg     Recent laboratory studies:  Recent Labs    09/30/21 0549  WBC 21.6*  HGB 12.4*  HCT 37.7*  PLT 315  NA 141  K 4.4  CL 102  CO2 28  BUN 8  CREATININE 0.88  GLUCOSE 143*  CALCIUM 9.1     Discharge Medications:   Allergies as of 09/30/2021       Reactions   Codeine Itching   Sulfa Antibiotics Nausea And Vomiting        Medication List     STOP taking these medications    acetaminophen 500 MG tablet Commonly known as: TYLENOL   oxycodone 30 MG immediate release tablet Commonly known as: ROXICODONE       TAKE these medications    aspirin EC 81 MG tablet Take 1 tablet (81 mg total) by mouth 2 (two) times daily. To be taken after surgery to prevent blood clots   B-12 PO Take 1 tablet by mouth daily.   cephALEXin 500 MG capsule Commonly known as: Keflex Take 1 capsule (500 mg total) by mouth 4 (four) times daily.   diphenhydrAMINE 25 MG tablet Commonly known as: BENADRYL Take 50 mg by mouth daily as needed for allergies.   docusate sodium 100 MG  capsule Commonly known as: Colace Take 1 capsule (100 mg total) by mouth daily as needed.   esomeprazole 40 MG capsule Commonly known as: NEXIUM Take 40 mg by mouth daily at 12 noon.   fluticasone 50 MCG/ACT nasal spray Commonly known as: FLONASE Place 1 spray into both nostrils 2 (two) times daily as needed for allergies.   gabapentin 600 MG tablet Commonly known as: NEURONTIN Take 600 mg by mouth 3 (three) times daily.   lisinopril 10 MG tablet Commonly known as: ZESTRIL Take 10 mg by mouth daily.   Livalo 4 MG Tabs Generic drug: Pitavastatin Calcium Take 2 mg by mouth daily.   Magnesium 400 MG Tabs Take 800 mg by mouth daily.   metFORMIN 500 MG 24 hr tablet Commonly known as: GLUCOPHAGE-XR Take 500 mg by mouth 2 (two) times daily.   methocarbamol 500 MG tablet Commonly known as: Robaxin Take 1 tablet (500 mg total) by mouth 2 (two) times daily as needed.   ondansetron 4 MG tablet Commonly known as: Zofran Take 1 tablet (4 mg total) by mouth every 8 (eight) hours as needed for nausea or vomiting.   oxyCODONE-acetaminophen 5-325 MG tablet Commonly known as: Percocet Take 1-2 tablets by mouth every 6 (six) hours as needed. To be taken after surgery   temazepam 30 MG capsule Commonly known as: RESTORIL Take 30 mg by mouth at bedtime as needed for sleep.               Durable Medical Equipment  (From admission, onward)           Start     Ordered   09/29/21 1810  DME Walker rolling  Once       Question:  Patient needs a walker to treat with the following condition  Answer:  History of hip replacement   09/29/21 1809   09/29/21 1810  DME 3 n 1  Once        09/29/21 1809   09/29/21 1810  DME Bedside commode  Once       Question:  Patient needs a bedside commode to treat with the following condition  Answer:  History of hip replacement   09/29/21 1809            Diagnostic Studies: DG Pelvis Portable  Result Date: 09/29/2021 CLINICAL DATA:  Hip joint replacement. EXAM: PORTABLE PELVIS 1-2 VIEWS COMPARISON:  Intraoperative views earlier the same date. FINDINGS: 1528 hours. Two views are submitted. Patient is status post right total hip arthroplasty with a screw fixed acetabular component. The hardware is intact. No evidence of acute fracture or dislocation. Mild degenerative changes are present in the lower lumbar spine and left hip. IMPRESSION: Status post right total hip arthroplasty without demonstrated complication. Electronically Signed   By: Richardean Sale M.D.   On: 09/29/2021 15:36   DG C-Arm 1-60 Min-No Report  Result Date: 09/29/2021 Fluoroscopy was utilized by the requesting physician.  No radiographic interpretation.   DG C-Arm 1-60 Min-No Report  Result Date: 09/29/2021 Fluoroscopy was utilized by the requesting physician.  No radiographic interpretation.   DG HIP UNILAT WITH PELVIS 1V RIGHT  Result Date: 09/29/2021 CLINICAL DATA:  Right total hip arthroplasty, anterior approach. EXAM: DG HIP (WITH OR WITHOUT PELVIS) 1V RIGHT COMPARISON:  Radiographs 08/12/2021 FINDINGS: C-arm fluoroscopy provided in the operating room. 24 seconds of fluoroscopy time. 4.05 mGy. 6 spot fluoroscopic images are submitted from the operating room. These demonstrate the performance of a right total hip arthroplasty superior by single acetabular screw. The hardware appears well positioned on the final images. No complications are identified. IMPRESSION: Intraoperative views during performance of right total hip arthroplasty. No demonstrated complication. Electronically Signed   By: Richardean Sale M.D.   On: 09/29/2021 14:50    Disposition: Discharge disposition: 01-Home or Self Care          Follow-up Information     Leandrew Koyanagi, MD. Schedule an appointment as soon as possible for a visit in 2 week(s).   Specialty: Orthopedic Surgery Contact information: [address] [place] [address] [phone]                   Signed: Aundra Dubin 09/30/2021, 8:04 AM

## 2021-09-30 NOTE — Progress Notes (Signed)
Patient alert and oriented, voiding adequately, skin clean, dry and intact without evidence of skin break down, or symptoms of complications - no redness or edema noted, only slight tenderness at site.  Patient states pain is manageable at time of discharge. Patient has an appointment with MD in 2 weeks 

## 2021-09-30 NOTE — TOC Progression Note (Addendum)
Transition of Care [facility]) - Progression Note    Patient Details  Name: Jacob Bates MRN: [id number] Date of Birth: April 29, 1961  Transition of Care [facility]) CM/SW Contact  Nadene Rubins Adria Devon, RN Phone Number: 09/30/2021, 8:22 AM  Clinical Narrative:     Dr Roda Shutters office has arranged home health with [facility] confirmed with [facility] with [facility].   3C staff will provide any needed DME.  Transition of Care [facility]) Screening Note   Patient Details  Name: Jacob Bates Date of Birth: 03/27/1961     Transition of Care Department [facility]) has reviewed patient and no TOC needs have been identified at this time. We will continue to monitor patient advancement through interdisciplinary progression rounds. If new patient transition needs arise, please place a TOC consult.  u office has arranged home health PT through [facility].   3c Staff will provide any needed DME.        Expected Discharge Plan and Services           Expected Discharge Date: 09/30/21                                     Social Determinants of Health (SDOH) Interventions    Readmission Risk Interventions No flowsheet data found.

## 2021-10-07 ENCOUNTER — Other Ambulatory Visit: Payer: Self-pay | Admitting: Physician Assistant

## 2021-10-07 ENCOUNTER — Telehealth: Payer: Self-pay | Admitting: Orthopaedic Surgery

## 2021-10-07 MED ORDER — ZOLPIDEM TARTRATE 5 MG PO TABS: 5.0000 mg | ORAL_TABLET | Freq: Every evening | ORAL | 0 refills | Status: DC | PRN

## 2021-10-07 NOTE — Telephone Encounter (Signed)
Patient aware.

## 2021-10-07 NOTE — Telephone Encounter (Signed)
Patient called. Says he needs something to take at night. He is not sleeping well do to the pain. His call back number is [phone]

## 2021-10-07 NOTE — Telephone Encounter (Signed)
I would first try taking a melatonin or benadryl if he has not already.  If that does not work, the next night he may start the [place] that I have called in.

## 2021-10-14 ENCOUNTER — Encounter: Payer: Self-pay | Admitting: Orthopaedic Surgery

## 2021-10-14 ENCOUNTER — Telehealth: Payer: Self-pay | Admitting: Orthopaedic Surgery

## 2021-10-14 ENCOUNTER — Other Ambulatory Visit: Payer: Self-pay

## 2021-10-14 ENCOUNTER — Ambulatory Visit (INDEPENDENT_AMBULATORY_CARE_PROVIDER_SITE_OTHER): Payer: BC Managed Care – PPO | Admitting: Orthopaedic Surgery

## 2021-10-14 DIAGNOSIS — Z96641 Presence of right artificial hip joint: Secondary | ICD-10-CM

## 2021-10-14 NOTE — Progress Notes (Signed)
° °  Post-Op Visit Note   Patient: Jacob Bates           Date of Birth: [birth year]           MRN: [id number] Visit Date: 10/14/2021 PCP: Raynelle Jan, MD   Assessment & Plan:  Chief Complaint:  Chief Complaint  Patient presents with   Right Hip - Pain   Visit Diagnoses:  1. Status post total replacement of right hip     Plan: Jacob Bates is 2 weeks status post right total hip replacement.  He is ambulating very well occasionally with a cane but does not really need it.  He takes oxycodone for chronic pain prescribed by PCP.  No real complaints today.  Examination of the right hip shows a healed surgical incision with intact sutures.  No signs of infection.  Expected postoperative swelling.  Neurovascular intact distally.  His gait is actually quite good.  He does not need a cane based on my assessment.  We remove the sutures and placed Steri-Strips today.  Wound care instructions reviewed.  Handicap placard and implant card provided and dental prophylaxis reinforced.  Continue aspirin for DVT prophylaxis.  Recheck in 4 weeks with standing AP pelvis x-rays.  Follow-Up Instructions: No follow-ups on file.   Orders:  No orders of the defined types were placed in this encounter.  No orders of the defined types were placed in this encounter.   Imaging: No results found.  PMFS History: Patient Active Problem List   Diagnosis Date Noted   Status post total replacement of right hip 09/29/2021   Primary osteoarthritis of right hip 09/28/2021   Past Medical History:  Diagnosis Date   Anxiety    Arthritis    Diabetes mellitus without complication (H[facility])    Hypertension     History reviewed. No pertinent family history.  Past Surgical History:  Procedure Laterality Date   BACK SURGERY     KNEE SURGERY Right    ROTATOR CUFF REPAIR     TOTAL HIP ARTHROPLASTY Right 09/29/2021   Procedure: RIGHT TOTAL HIP ARTHROPLASTY ANTERIOR APPROACH;  Surgeon: Tarry Kos, MD;  Location: [facility]  OR;  Service: Orthopedics;  Laterality: Right;  3-C   Social History   Occupational History   Not on file  Tobacco Use   Smoking status: Never   Smokeless tobacco: Not on file  Vaping Use   Vaping Use: Never used  Substance and Sexual Activity   Alcohol use: No   Drug use: Yes    Types: Other-see comments    Comment: CBD   Sexual activity: Not on file

## 2021-10-14 NOTE — Telephone Encounter (Signed)
Received $25.00 cash,medical records release form and FMLA paperwork from patient/Forwarding to [employer] today 

## 2021-11-11 ENCOUNTER — Ambulatory Visit (INDEPENDENT_AMBULATORY_CARE_PROVIDER_SITE_OTHER): Payer: BC Managed Care – PPO

## 2021-11-11 ENCOUNTER — Ambulatory Visit (INDEPENDENT_AMBULATORY_CARE_PROVIDER_SITE_OTHER): Payer: BC Managed Care – PPO | Admitting: Orthopaedic Surgery

## 2021-11-11 ENCOUNTER — Encounter: Payer: Self-pay | Admitting: Orthopaedic Surgery

## 2021-11-11 ENCOUNTER — Other Ambulatory Visit: Payer: Self-pay

## 2021-11-11 DIAGNOSIS — Z96641 Presence of right artificial hip joint: Secondary | ICD-10-CM | POA: Diagnosis not present

## 2021-11-11 NOTE — Progress Notes (Signed)
? ?  Post-Op Visit Note ?  ?Patient: Jacob Bates           ?Date of Birth: 1960/09/07           ?MRN: [id number] ?Visit Date: 11/11/2021 ?PCP: Raynelle Jan, MD ? ? ?Assessment & Plan: ? ?Chief Complaint:  ?Chief Complaint  ?Patient presents with  ? Right Hip - Pain  ? ?Visit Diagnoses:  ?1. Status post total replacement of right hip   ? ? ?Plan: Patient is a pleasant 61 year old gentleman who comes in today 6 weeks status post right total hip replacement 09/29/2021.  He has been doing great.  He feels great.   Examination of the right hip reveals painless logroll.  Full range of motion and strength.  He is neurovascular intact distally.  At this point, he will continue to advance activity as tolerated.  Dental prophylaxis reinforced.  Follow-up with [place] in 6 weeks time for recheck.  Call with concerns or questions. ? ?Follow-Up Instructions: Return in about 6 weeks (around 12/23/2021).  ? ?Orders:  ?Orders Placed This Encounter  ?Procedures  ? XR Pelvis 1-2 Views  ? ?No orders of the defined types were placed in this encounter. ? ? ?Imaging: ?XR Pelvis 1-2 Views ? ?Result Date: 11/11/2021 ?X-rays demonstrate a well-seated prosthesis without complication  ? ?PMFS History: ?Patient Active Problem List  ? Diagnosis Date Noted  ? Status post total replacement of right hip 09/29/2021  ? Primary osteoarthritis of right hip 09/28/2021  ? ?Past Medical History:  ?Diagnosis Date  ? Anxiety   ? Arthritis   ? Diabetes mellitus without complication (HCC)   ? Hypertension   ?  ?History reviewed. No pertinent family history.  ?Past Surgical History:  ?Procedure Laterality Date  ? BACK SURGERY    ? KNEE SURGERY Right   ? ROTATOR CUFF REPAIR    ? TOTAL HIP ARTHROPLASTY Right 09/29/2021  ? Procedure: RIGHT TOTAL HIP ARTHROPLASTY ANTERIOR APPROACH;  Surgeon: Tarry Kos, MD;  Location: [facility] OR;  Service: Orthopedics;  Laterality: Right;  3-C  ? ?Social History  ? ?Occupational History  ? Not on file  ?Tobacco Use  ? Smoking status: Never   ? Smokeless tobacco: Not on file  ?Vaping Use  ? Vaping Use: Never used  ?Substance and Sexual Activity  ? Alcohol use: No  ? Drug use: Yes  ?  Types: Other-see comments  ?  Comment: CBD  ? Sexual activity: Not on file  ? ? ? ?

## 2021-11-13 ENCOUNTER — Telehealth: Payer: Self-pay | Admitting: Orthopaedic Surgery

## 2021-11-13 ENCOUNTER — Other Ambulatory Visit: Payer: Self-pay | Admitting: Physician Assistant

## 2021-11-13 NOTE — Telephone Encounter (Signed)
Pt called requesting pre meds for his dental appt Monday morning. Please send pre meds pt said to [facility]  in [place]. Please call pt to confirm pharmacy at [phone]. ?

## 2021-11-13 NOTE — Telephone Encounter (Signed)
Tylenol is only as needed.  If he meant the baby aspirin, he can stop this

## 2021-11-13 NOTE — Telephone Encounter (Signed)
Pt asking if he needs to continue taking 2 tylenol per day like he has been previously post surg. The best call back number is [phone]. ?

## 2021-11-13 NOTE — Telephone Encounter (Signed)
I pended the order bc I cannot find a [facility] in [place].   Can you confirm pharmacy and send in

## 2021-11-14 ENCOUNTER — Other Ambulatory Visit: Payer: Self-pay | Admitting: Physician Assistant

## 2021-11-14 MED ORDER — AMOXICILLIN 500 MG PO CAPS: ORAL_CAPSULE | ORAL | 2 refills | Status: AC

## 2021-11-14 NOTE — Telephone Encounter (Signed)
Uses [place] ?

## 2021-11-14 NOTE — Telephone Encounter (Signed)
sent 

## 2021-11-15 ENCOUNTER — Encounter (HOSPITAL_BASED_OUTPATIENT_CLINIC_OR_DEPARTMENT_OTHER): Payer: Self-pay | Admitting: Emergency Medicine

## 2021-11-15 ENCOUNTER — Emergency Department (HOSPITAL_BASED_OUTPATIENT_CLINIC_OR_DEPARTMENT_OTHER): Payer: BC Managed Care – PPO

## 2021-11-15 ENCOUNTER — Emergency Department (HOSPITAL_BASED_OUTPATIENT_CLINIC_OR_DEPARTMENT_OTHER)
Admission: EM | Admit: 2021-11-15 | Discharge: 2021-11-15 | Disposition: A | Discharge status: Home / Self Care | Payer: BC Managed Care – PPO | Attending: Emergency Medicine | Admitting: Emergency Medicine

## 2021-11-15 ENCOUNTER — Other Ambulatory Visit: Payer: Self-pay

## 2021-11-15 DIAGNOSIS — Z7984 Long term (current) use of oral hypoglycemic drugs: Secondary | ICD-10-CM | POA: Insufficient documentation

## 2021-11-15 DIAGNOSIS — R002 Palpitations: Secondary | ICD-10-CM | POA: Insufficient documentation

## 2021-11-15 DIAGNOSIS — F419 Anxiety disorder, unspecified: Secondary | ICD-10-CM | POA: Diagnosis not present

## 2021-11-15 DIAGNOSIS — R531 Weakness: Secondary | ICD-10-CM | POA: Insufficient documentation

## 2021-11-15 DIAGNOSIS — Z79899 Other long term (current) drug therapy: Secondary | ICD-10-CM | POA: Insufficient documentation

## 2021-11-15 DIAGNOSIS — I10 Essential (primary) hypertension: Secondary | ICD-10-CM

## 2021-11-15 DIAGNOSIS — Z20822 Contact with and (suspected) exposure to covid-19: Secondary | ICD-10-CM | POA: Insufficient documentation

## 2021-11-15 DIAGNOSIS — Z7982 Long term (current) use of aspirin: Secondary | ICD-10-CM | POA: Diagnosis not present

## 2021-11-15 LAB — TROPONIN I (HIGH SENSITIVITY)
Troponin I (High Sensitivity): 4 ng/L (ref <18)
Troponin I (High Sensitivity): 5 ng/L

## 2021-11-15 LAB — BASIC METABOLIC PANEL
Anion gap: 9 (ref 5–15)
BUN: 7 mg/dL — ABNORMAL LOW (ref 8–23)
CO2: 25 mmol/L (ref 22–32)
Calcium: 8.8 mg/dL — ABNORMAL LOW (ref 8.9–10.3)
Chloride: 103 mmol/L (ref 98–111)
Creatinine, Ser: 0.69 mg/dL (ref 0.61–1.24)
GFR, Estimated: >60 mL/min (ref >60)
Glucose, Bld: 158 mg/dL — ABNORMAL HIGH (ref 70–99)
Potassium: 3.6 mmol/L (ref 3.5–5.1)
Sodium: 137 mmol/L (ref 135–145)

## 2021-11-15 LAB — RESP PANEL BY RT-PCR (FLU A&B, COVID) ARPGX2
Influenza A by PCR: NEGATIVE
Influenza B by PCR: NEGATIVE
SARS Coronavirus 2 by RT PCR: NEGATIVE

## 2021-11-15 LAB — URINALYSIS, ROUTINE W REFLEX MICROSCOPIC
Bilirubin Urine: NEGATIVE
Glucose, UA: NEGATIVE mg/dL
Hgb urine dipstick: NEGATIVE
Ketones, ur: NEGATIVE mg/dL
Leukocytes,Ua: NEGATIVE
Nitrite: NEGATIVE
Protein, ur: NEGATIVE mg/dL
Specific Gravity, Urine: 1.02 (ref 1.005–1.030)
pH: 7 (ref 5.0–8.0)

## 2021-11-15 LAB — CBC WITH DIFFERENTIAL/PLATELET
Abs Immature Granulocytes: 0.03 10*3/uL (ref 0.00–0.07)
Basophils Absolute: 0 10*3/uL (ref 0.0–0.1)
Basophils Relative: 0 %
Eosinophils Absolute: 0.1 10*3/uL (ref 0.0–0.5)
Eosinophils Relative: 1 %
HCT: 40.3 % (ref 39.0–52.0)
Hemoglobin: 13.4 g/dL (ref 13.0–17.0)
Immature Granulocytes: 0 %
Lymphocytes Relative: 17 %
Lymphs Abs: 1.6 10*3/uL (ref 0.7–4.0)
MCH: 28.1 pg (ref 26.0–34.0)
MCHC: 33.3 g/dL (ref 30.0–36.0)
MCV: 84.5 fL (ref 80.0–100.0)
Monocytes Absolute: 0.9 10*3/uL (ref 0.1–1.0)
Monocytes Relative: 9 %
Neutro Abs: 6.6 10*3/uL (ref 1.7–7.7)
Neutrophils Relative %: 73 %
Platelets: 398 10*3/uL (ref 150–400)
RBC: 4.77 MIL/uL (ref 4.22–5.81)
RDW: 13.4 % (ref 11.5–15.5)
WBC: 9.2 10*3/uL (ref 4.0–10.5)
nRBC: 0 % (ref 0.0–0.2)

## 2021-11-15 MED ORDER — HYDROXYZINE HCL 25 MG PO TABS
25.0000 mg | ORAL_TABLET | Freq: Once | ORAL | Status: AC
  Administered 2021-11-15: 25 mg via ORAL
  Filled 2021-11-15: qty 1

## 2021-11-15 NOTE — ED Notes (Signed)
Patient transported to X-ray 

## 2021-11-15 NOTE — Discharge Instructions (Signed)
You are seen in the emergency department today for high blood pressure.  While you are here we checked your lab work, EKG and chest x-ray.  Everything was normal.  Please follow-up with your primary care provider early this week to manage her blood pressure.  Please return to the emergency department for any worsening symptoms. ?

## 2021-11-15 NOTE — ED Notes (Signed)
ED Provider at bedside. 

## 2021-11-15 NOTE — ED Provider Notes (Signed)
?[facility] EMERGENCY DEPARTMENT ?Provider Note ? ? ?CSN: [id number] ?Arrival date & time: 11/15/21  1339 ? ?  ? ?History ? ?Chief Complaint  ?Patient presents with  ? Hypertension  ? ? ?Jacob Bates is a 61 y.o. male.  With past medical history of hypertension who presents emergency department with elevated blood pressure. ? ?Patient states for the past 3 to 4 days he has had labile blood pressure and feeling unwell.  He states that he feels weak like he has no energy.  He also has endorsed headache and palpitations.  States that at home over the past few days his blood pressure has been as high as 205 systolic.  States that this morning he took a double dose of his lisinopril at 6 AM to bring his blood pressure down.  He denies lightheadedness or dizziness, shortness of breath, abdominal pain, nausea or vomiting, lower extremity swelling.  He does not see a cardiologist. ? ? ?Hypertension ?Associated symptoms include headaches. Pertinent negatives include no chest pain, no abdominal pain and no shortness of breath.  ? ?  ? ?Home Medications ?Prior to Admission medications   ?Medication Sig Start Date End Date Taking? Authorizing Provider  ?amoxicillin (AMOXIL) 500 MG capsule Take two pills one hour prior to dental work 11/14/21   Cristie Hem, PA-C  ?aspirin EC 81 MG tablet Take 1 tablet (81 mg total) by mouth 2 (two) times daily. To be taken after surgery to prevent blood clots 09/22/21   Cristie Hem, PA-C  ?docusate sodium (COLACE) 100 MG capsule Take 1 capsule (100 mg total) by mouth daily as needed. 09/22/21 09/22/22  Cristie Hem, PA-C  ?methocarbamol (ROBAXIN) 500 MG tablet Take 1 tablet (500 mg total) by mouth 2 (two) times daily as needed. 09/22/21   Cristie Hem, PA-C  ?ondansetron (ZOFRAN) 4 MG tablet Take 1 tablet (4 mg total) by mouth every 8 (eight) hours as needed for nausea or vomiting. 09/22/21   Cristie Hem, PA-C  ?oxyCODONE-acetaminophen (PERCOCET) 5-325 MG tablet Take 1-2  tablets by mouth every 6 (six) hours as needed. To be taken after surgery 09/22/21   Cristie Hem, PA-C  ?cephALEXin (KEFLEX) 500 MG capsule Take 1 capsule (500 mg total) by mouth 4 (four) times daily. 09/29/21   Cristie Hem, PA-C  ?Cyanocobalamin (B-12 PO) Take 1 tablet by mouth daily.    [provider]  ?diphenhydrAMINE (BENADRYL) 25 MG tablet Take 50 mg by mouth daily as needed for allergies.    [provider]  ?esomeprazole (NEXIUM) 40 MG capsule Take 40 mg by mouth daily at 12 noon.    [provider]  ?fluticasone (FLONASE) 50 MCG/ACT nasal spray Place 1 spray into both nostrils 2 (two) times daily as needed for allergies. 08/14/21   [provider]  ?gabapentin (NEURONTIN) 600 MG tablet Take 600 mg by mouth 3 (three) times daily.    [provider]  ?lisinopril (PRINIVIL,ZESTRIL) 10 MG tablet Take 10 mg by mouth daily. 02/01/14   [provider]  ?Magnesium 400 MG TABS Take 800 mg by mouth daily.    [provider]  ?metFORMIN (GLUCOPHAGE-XR) 500 MG 24 hr tablet Take 500 mg by mouth 2 (two) times daily. 12/06/13   [provider]  ?Pitavastatin Calcium (LIVALO) 4 MG TABS Take 2 mg by mouth daily.    [provider]  ?temazepam (RESTORIL) 30 MG capsule Take 30 mg by mouth at bedtime as needed for  sleep. 08/14/21   [provider]  ?zolpidem (AMBIEN) 5 MG tablet Take 1 tablet (5 mg total) by mouth at bedtime as needed for sleep. 10/07/21   Cristie HemStanbery, Mary L, PA-C  ?   ? ?Allergies    ?Codeine and Sulfa antibiotics   ? ?Review of Systems   ?Review of Systems  ?Constitutional:  Negative for fever.  ?Respiratory:  Negative for shortness of breath.   ?Cardiovascular:  Positive for palpitations. Negative for chest pain and leg swelling.  ?Gastrointestinal:  Negative for abdominal pain, nausea and vomiting.  ?Neurological:  Positive for headaches. Negative for dizziness and light-headedness.  ?All other systems reviewed and  are negative. ? ?Physical Exam ?Updated Vital Signs ?BP (!) 149/77   Pulse 100   Temp 97.6 ?F (36.4 ?C) (Oral)   Resp 15   Ht 5\' 6"  (1.676 m)   Wt 97.5 kg   SpO2 98%   BMI 34.70 kg/m?  ?Physical Exam ?Vitals and nursing note reviewed.  ?Constitutional:   ?   General: He is not in acute distress. ?   Appearance: Normal appearance. He is normal weight. He is not ill-appearing or toxic-appearing.  ?HENT:  ?   Head: Normocephalic and atraumatic.  ?   Nose: Nose normal.  ?   Mouth/Throat:  ?   Mouth: Mucous membranes are moist.  ?   Pharynx: Oropharynx is clear.  ?Eyes:  ?   General: No scleral icterus. ?   Extraocular Movements: Extraocular movements intact.  ?   Conjunctiva/sclera: Conjunctivae normal.  ?   Pupils: Pupils are equal, round, and reactive to light.  ?Cardiovascular:  ?   Rate and Rhythm: Normal rate and regular rhythm.  ?   Pulses: Normal pulses.     ?     Radial pulses are 2+ on the right side and 2+ on the left side.  ?     Dorsalis pedis pulses are 2+ on the right side and 2+ on the left side.  ?   Heart sounds: Normal heart sounds. No murmur heard. ?Pulmonary:  ?   Effort: Pulmonary effort is normal. No respiratory distress.  ?   Breath sounds: Normal breath sounds.  ?Chest:  ?   Chest wall: No tenderness.  ?Abdominal:  ?   General: Bowel sounds are normal. There is no distension.  ?   Palpations: Abdomen is soft.  ?   Tenderness: There is no abdominal tenderness.  ?Musculoskeletal:     ?   General: Normal range of motion.  ?   Cervical back: Neck supple.  ?   Right lower leg: No edema.  ?   Left lower leg: No edema.  ?Skin: ?   General: Skin is warm and dry.  ?   Capillary Refill: Capillary refill takes less than 2 seconds.  ?Neurological:  ?   General: No focal deficit present.  ?   Mental Status: He is alert and oriented to person, place, and time. Mental status is at baseline.  ?Psychiatric:     ?   Mood and Affect: Mood is anxious.     ?   Behavior: Behavior normal.     ?   Thought  Content: Thought content normal.     ?   Judgment: Judgment normal.  ? ? ?ED Results / Procedures / Treatments   ?Labs ?(all labs ordered are listed, but only abnormal results are displayed) ?Labs Reviewed  ?BASIC METABOLIC PANEL - Abnormal; Notable for the following components:  ?  Result Value  ? Glucose, Bld 158 (*)   ? BUN 7 (*)   ? Calcium 8.8 (*)   ? All other components within normal limits  ?RESP PANEL BY RT-PCR (FLU A&B, COVID) ARPGX2  ?CBC WITH DIFFERENTIAL/PLATELET  ?URINALYSIS, ROUTINE W REFLEX MICROSCOPIC  ?TROPONIN I (HIGH SENSITIVITY)  ?TROPONIN I (HIGH SENSITIVITY)  ? ?EKG ?EKG Interpretation ? ?Date/Time:  Saturday November 15 2021 13:58:09 EDT ?Ventricular Rate:  103 ?PR Interval:  148 ?QRS Duration: 86 ?QT Interval:  328 ?QTC Calculation: 429 ?R Axis:   189 ?Text Interpretation: Sinus tachycardia Right superior axis deviation Right ventricular hypertrophy Abnormal ECG When compared with ECG of 26-Sep-2021 10:42, No significant change since last tracing Confirmed by Meridee Score [phone]) on 11/15/2021 2:03:17 PM ? ?Radiology ?DG Chest 2 View ? ?Result Date: 11/15/2021 ?CLINICAL DATA:  Chest pain. EXAM: CHEST - 2 VIEW COMPARISON:  None. FINDINGS: The heart size and mediastinal contours are within normal limits. Both lungs are clear. The visualized skeletal structures are unremarkable. IMPRESSION: No active cardiopulmonary disease. Electronically Signed   By: Gerome Sam III M.D.   On: 11/15/2021 14:20   ? ?Procedures ?Procedures  ? ?Medications Ordered in ED ?Medications  ?hydrOXYzine (ATARAX) tablet 25 mg (25 mg Oral Given 11/15/21 1652)  ? ? ?ED Course/ Medical Decision Making/ A&P ?  ?                        ?Medical Decision Making ?Amount and/or Complexity of Data Reviewed ?Labs: ordered. ?Radiology: ordered. ? ?Risk ?Prescription drug management. ? ?This patient presents to the ED for concern of hypertension, this involves an extensive number of treatment options, and is a complaint that  carries with it a high risk of complications and morbidity.  The differential diagnosis includes hypertensive urgency, hypertensive emergency and ACS ? ? ?Co morbidities that complicate the patient evaluation ?Hypertens

## 2021-11-15 NOTE — ED Triage Notes (Signed)
Pt arrives pov steady gait c/o hypertension with HA and weakness. Endorses hip replacement x 1 month pta, reports b/p has been increasing since surgery. Pt also reports chest tightness ?

## 2021-12-23 ENCOUNTER — Ambulatory Visit: Payer: BC Managed Care – PPO | Admitting: Orthopaedic Surgery

## 2022-04-21 ENCOUNTER — Ambulatory Visit (INDEPENDENT_AMBULATORY_CARE_PROVIDER_SITE_OTHER): Payer: BC Managed Care – PPO | Admitting: Orthopaedic Surgery

## 2022-04-21 ENCOUNTER — Encounter: Payer: Self-pay | Admitting: Orthopaedic Surgery

## 2022-04-21 ENCOUNTER — Ambulatory Visit (INDEPENDENT_AMBULATORY_CARE_PROVIDER_SITE_OTHER): Payer: BC Managed Care – PPO

## 2022-04-21 DIAGNOSIS — Z96641 Presence of right artificial hip joint: Secondary | ICD-10-CM

## 2022-04-21 NOTE — Progress Notes (Signed)
   Post-Op Visit Note   Patient: Jacob Bates           Date of Birth: [birth year]           MRN: [id number] Visit Date: 04/21/2022 PCP: Raynelle Jan, MD   Assessment & Plan:  Chief Complaint:  Chief Complaint  Patient presents with   Right Hip - Follow-up    Right total hip arthroplasty 09/29/2021   Visit Diagnoses:  1. Status post total replacement of right hip     Plan:  Patient now 7 months status post total hip arthroplasty. Wound is healed with no signs of complications or infection.  The patient does not complain of pain, and is back to normal daily activities. Radiographs reveal a total hip arthroplasty in good position, with no evidence of subsidence, loosening, or complicating features. It was reinforced that prophylactic antibiotics should be taken with any procedure including but not limited to dental work or colonoscopies.  We will plan on following up at the 12 month postop visit with radiographs at that time. As always, instructions were given to call with any questions or concerns in the interim.   Follow-Up Instructions: Return in about 6 months (around 10/22/2022).   Orders:  Orders Placed This Encounter  Procedures   XR Pelvis 1-2 Views   No orders of the defined types were placed in this encounter.   Imaging: XR Pelvis 1-2 Views  Result Date: 04/21/2022 Stable total hip replacement without complications   PMFS History: Patient Active Problem List   Diagnosis Date Noted   Status post total replacement of right hip 09/29/2021   Primary osteoarthritis of right hip 09/28/2021   Past Medical History:  Diagnosis Date   Anxiety    Arthritis    Diabetes mellitus without complication (HCC)    Hypertension     No family history on file.  Past Surgical History:  Procedure Laterality Date   BACK SURGERY     KNEE SURGERY Right    ROTATOR CUFF REPAIR     TOTAL HIP ARTHROPLASTY Right 09/29/2021   Procedure: RIGHT TOTAL HIP ARTHROPLASTY ANTERIOR APPROACH;   Surgeon: Tarry Kos, MD;  Location: [facility] OR;  Service: Orthopedics;  Laterality: Right;  3-C   Social History   Occupational History   Not on file  Tobacco Use   Smoking status: Never   Smokeless tobacco: Not on file  Vaping Use   Vaping Use: Never used  Substance and Sexual Activity   Alcohol use: No   Drug use: Yes    Types: Other-see comments    Comment: CBD   Sexual activity: Not on file

## 2022-10-22 ENCOUNTER — Ambulatory Visit: Payer: BC Managed Care – PPO | Admitting: Orthopaedic Surgery

## 2023-05-20 IMAGING — DX DG CHEST 2V
2 series · 2 of 2 positions shown · non-contrast
Comparison: None.

CLINICAL DATA: Chest pain.

EXAM:
CHEST - 2 VIEW

[chest pa]
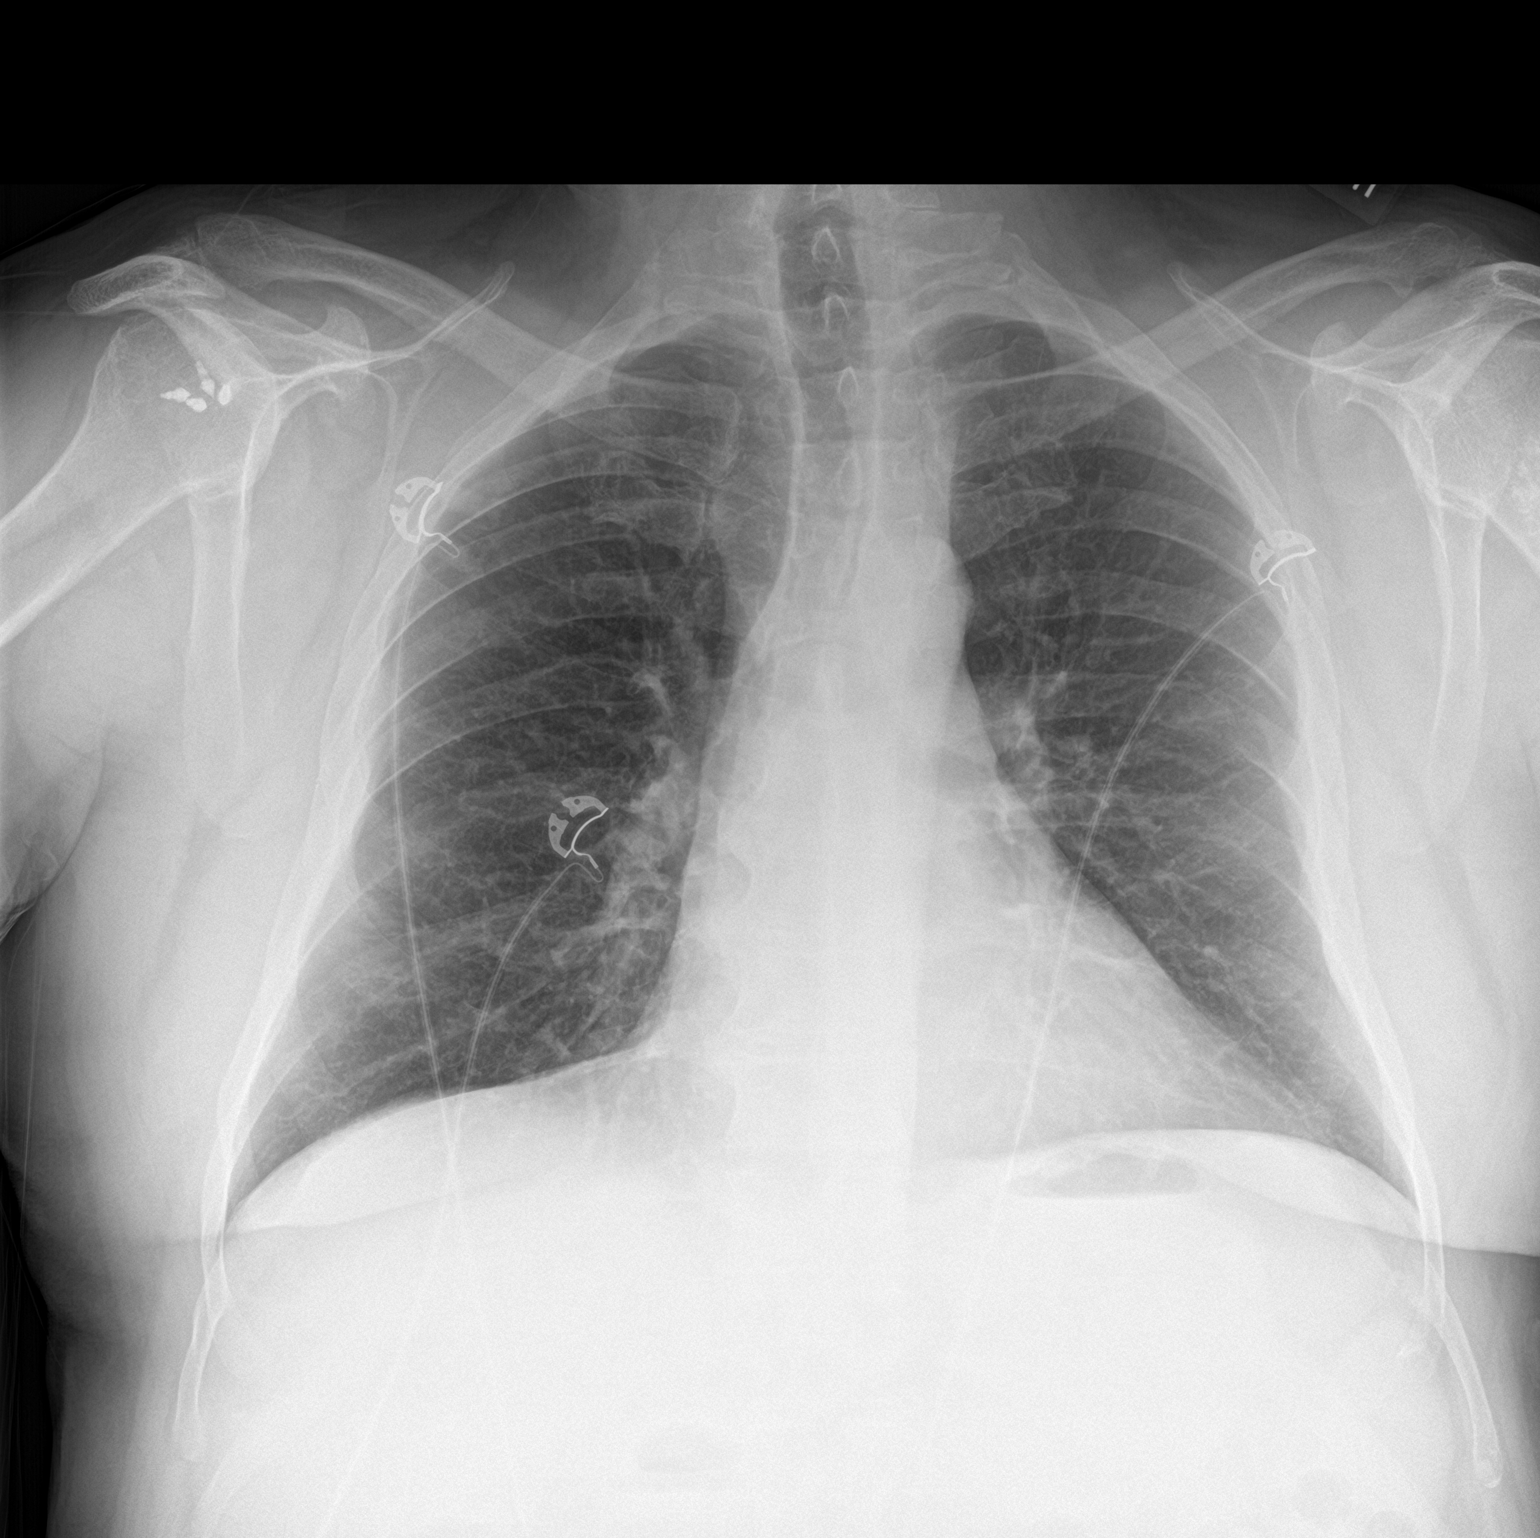

[chest lat]
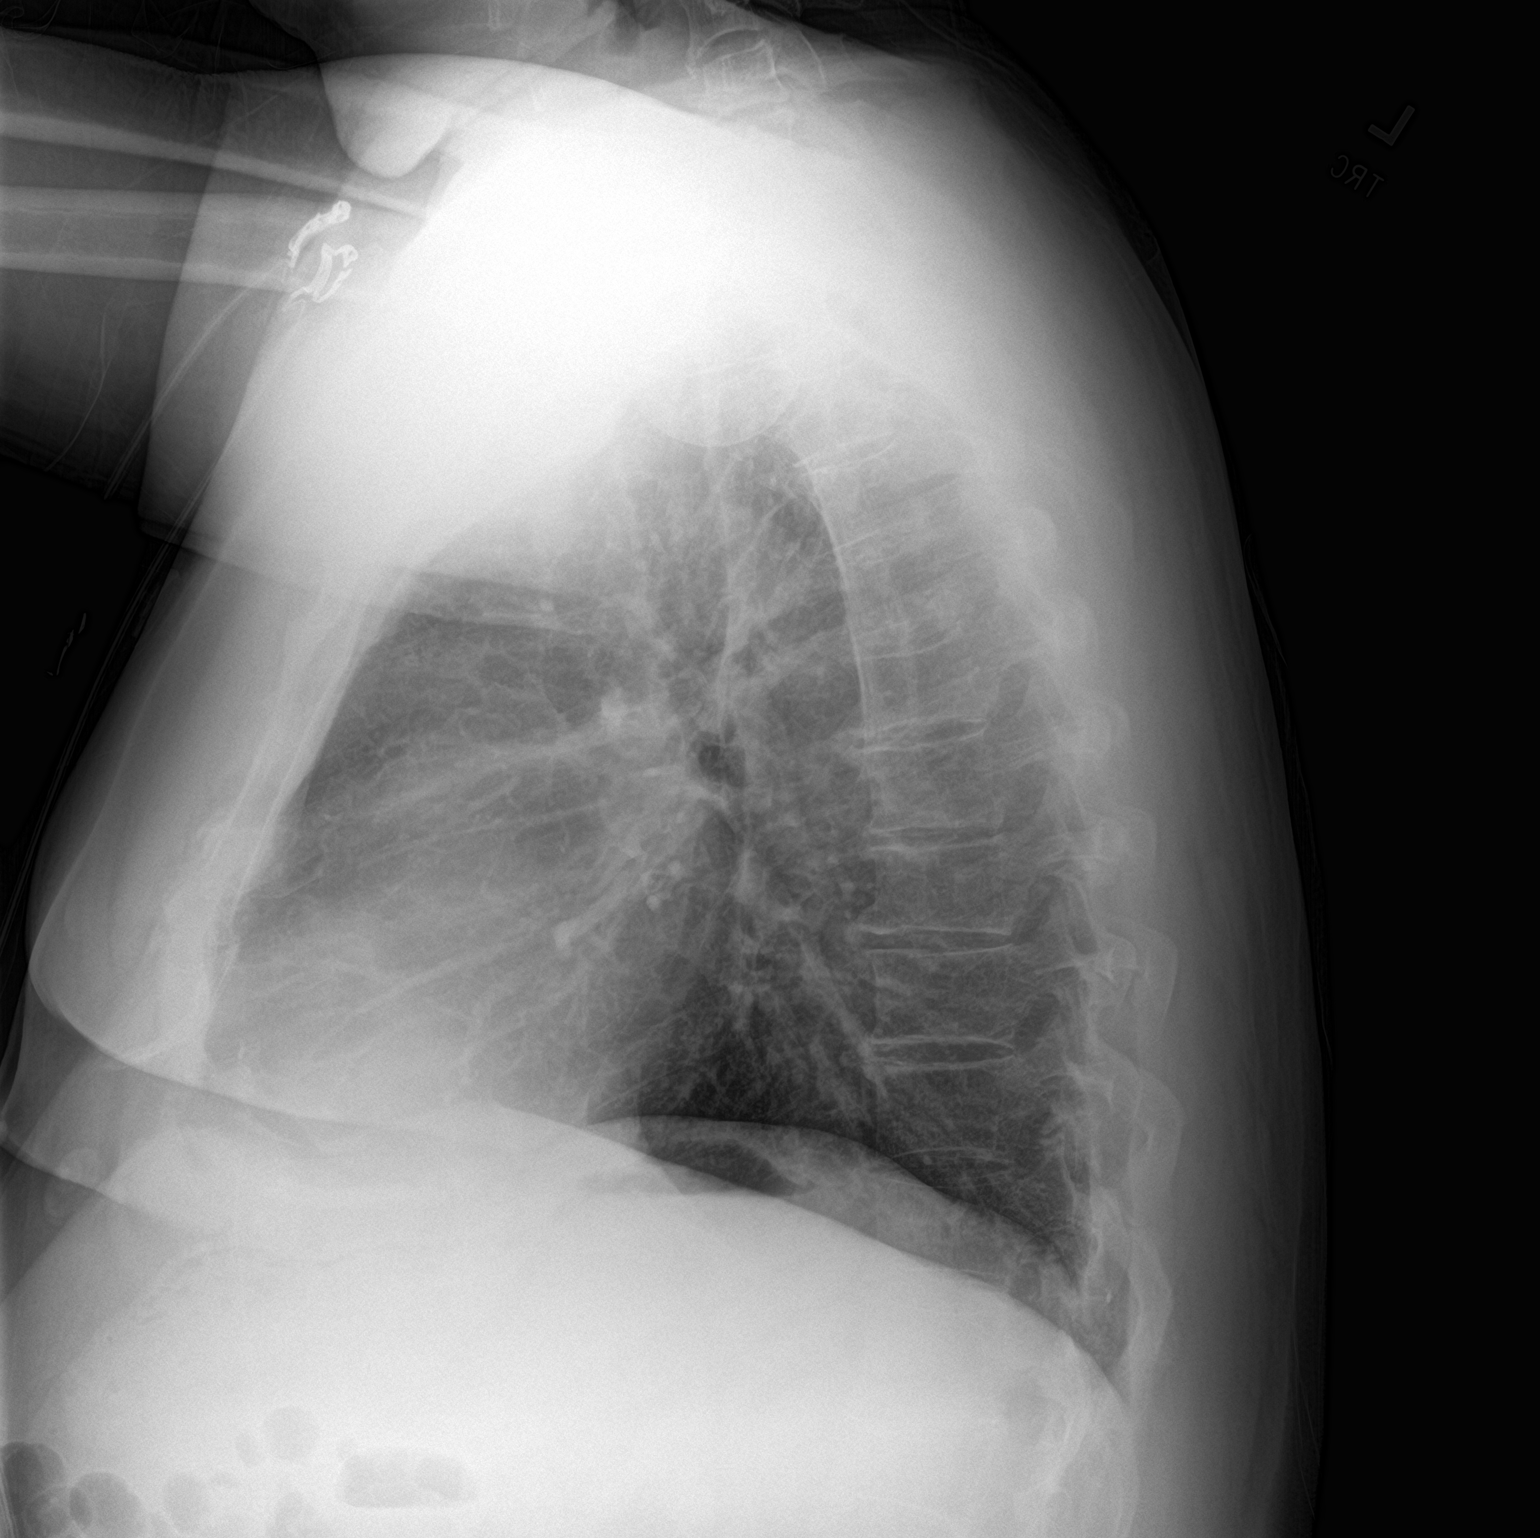

[2 of 2 positions shown; findings below may reference images not displayed]

FINDINGS: The heart size and mediastinal contours are within normal limits.
Both lungs are clear. The visualized skeletal structures are
unremarkable.
IMPRESSION: No active cardiopulmonary disease.
